# Patient Record
Sex: Male | Born: 1940 | Hispanic: No | Marital: Married | Smoking: Former smoker
Health system: Southern US, Community
[De-identification: ages and names within clinical notes are randomized; demographics above are authoritative.]

## PROBLEM LIST (undated history)

## (undated) DIAGNOSIS — N179 Acute kidney failure, unspecified: Secondary | ICD-10-CM

## (undated) DIAGNOSIS — D649 Anemia, unspecified: Secondary | ICD-10-CM

## (undated) DIAGNOSIS — F0280 Dementia in other diseases classified elsewhere without behavioral disturbance: Secondary | ICD-10-CM

## (undated) DIAGNOSIS — W19XXXA Unspecified fall, initial encounter: Secondary | ICD-10-CM

## (undated) DIAGNOSIS — B952 Enterococcus as the cause of diseases classified elsewhere: Secondary | ICD-10-CM

## (undated) DIAGNOSIS — N390 Urinary tract infection, site not specified: Secondary | ICD-10-CM

## (undated) DIAGNOSIS — G309 Alzheimer's disease, unspecified: Secondary | ICD-10-CM

---

## 2004-05-29 ENCOUNTER — Ambulatory Visit (HOSPITAL_BASED_OUTPATIENT_CLINIC_OR_DEPARTMENT_OTHER): Admission: RE | Admit: 2004-05-29 | Discharge: 2004-05-29 | Payer: Self-pay

## 2004-05-29 ENCOUNTER — Ambulatory Visit (HOSPITAL_COMMUNITY): Admission: RE | Admit: 2004-05-29 | Discharge: 2004-05-29 | Payer: Self-pay

## 2016-01-18 ENCOUNTER — Encounter (HOSPITAL_COMMUNITY): Payer: Self-pay

## 2016-01-18 ENCOUNTER — Inpatient Hospital Stay (HOSPITAL_COMMUNITY)
Admission: EM | Admit: 2016-01-18 | Discharge: 2016-01-23 | DRG: 689 | Disposition: A | Discharge status: Home Health | Payer: Medicare HMO | Attending: Family Medicine | Admitting: Family Medicine

## 2016-01-18 ENCOUNTER — Observation Stay (HOSPITAL_COMMUNITY): Payer: Medicare HMO

## 2016-01-18 DIAGNOSIS — D649 Anemia, unspecified: Secondary | ICD-10-CM | POA: Diagnosis present

## 2016-01-18 DIAGNOSIS — B952 Enterococcus as the cause of diseases classified elsewhere: Secondary | ICD-10-CM | POA: Diagnosis present

## 2016-01-18 DIAGNOSIS — F0391 Unspecified dementia with behavioral disturbance: Secondary | ICD-10-CM | POA: Diagnosis not present

## 2016-01-18 DIAGNOSIS — Z23 Encounter for immunization: Secondary | ICD-10-CM

## 2016-01-18 DIAGNOSIS — F0280 Dementia in other diseases classified elsewhere without behavioral disturbance: Secondary | ICD-10-CM

## 2016-01-18 DIAGNOSIS — F03918 Unspecified dementia, unspecified severity, with other behavioral disturbance: Secondary | ICD-10-CM | POA: Diagnosis present

## 2016-01-18 DIAGNOSIS — R410 Disorientation, unspecified: Secondary | ICD-10-CM | POA: Diagnosis present

## 2016-01-18 DIAGNOSIS — G9340 Encephalopathy, unspecified: Secondary | ICD-10-CM | POA: Diagnosis present

## 2016-01-18 DIAGNOSIS — N179 Acute kidney failure, unspecified: Secondary | ICD-10-CM | POA: Diagnosis present

## 2016-01-18 DIAGNOSIS — N390 Urinary tract infection, site not specified: Principal | ICD-10-CM | POA: Diagnosis present

## 2016-01-18 DIAGNOSIS — R451 Restlessness and agitation: Secondary | ICD-10-CM

## 2016-01-18 DIAGNOSIS — Z515 Encounter for palliative care: Secondary | ICD-10-CM

## 2016-01-18 DIAGNOSIS — Z87891 Personal history of nicotine dependence: Secondary | ICD-10-CM

## 2016-01-18 DIAGNOSIS — F0281 Dementia in other diseases classified elsewhere with behavioral disturbance: Secondary | ICD-10-CM | POA: Diagnosis present

## 2016-01-18 DIAGNOSIS — F0150 Vascular dementia without behavioral disturbance: Secondary | ICD-10-CM

## 2016-01-18 DIAGNOSIS — G4700 Insomnia, unspecified: Secondary | ICD-10-CM | POA: Diagnosis present

## 2016-01-18 DIAGNOSIS — Z66 Do not resuscitate: Secondary | ICD-10-CM | POA: Diagnosis present

## 2016-01-18 DIAGNOSIS — G309 Alzheimer's disease, unspecified: Secondary | ICD-10-CM | POA: Diagnosis present

## 2016-01-18 HISTORY — DX: Alzheimer's disease, unspecified: G30.9

## 2016-01-18 HISTORY — DX: Dementia in other diseases classified elsewhere, unspecified severity, without behavioral disturbance, psychotic disturbance, mood disturbance, and anxiety: F02.80

## 2016-01-18 LAB — CBC
HEMATOCRIT: 32.3 % — AB (ref 39.0–52.0)
HEMOGLOBIN: 10.1 g/dL — AB (ref 13.0–17.0)
MCH: 28.1 pg (ref 26.0–34.0)
MCHC: 31.3 g/dL (ref 30.0–36.0)
MCV: 90 fL (ref 78.0–100.0)
Platelets: 342 10*3/uL (ref 150–400)
RBC: 3.59 MIL/uL — AB (ref 4.22–5.81)
RDW: 14.7 % (ref 11.5–15.5)
WBC: 6.9 10*3/uL (ref 4.0–10.5)

## 2016-01-18 LAB — COMPREHENSIVE METABOLIC PANEL
ALT: 12 U/L — AB (ref 17–63)
ANION GAP: 7 (ref 5–15)
AST: 18 U/L (ref 15–41)
Albumin: 3.1 g/dL — ABNORMAL LOW (ref 3.5–5.0)
Alkaline Phosphatase: 69 U/L (ref 38–126)
BUN: 24 mg/dL — ABNORMAL HIGH (ref 6–20)
CHLORIDE: 113 mmol/L — AB (ref 101–111)
CO2: 24 mmol/L (ref 22–32)
CREATININE: 1.97 mg/dL — AB (ref 0.61–1.24)
Calcium: 9.4 mg/dL (ref 8.9–10.3)
GFR calc non Af Amer: 32 mL/min — ABNORMAL LOW (ref >60)
GFR, EST AFRICAN AMERICAN: 37 mL/min — AB (ref >60)
Glucose, Bld: 129 mg/dL — ABNORMAL HIGH (ref 65–99)
POTASSIUM: 5.5 mmol/L — AB (ref 3.5–5.1)
Sodium: 144 mmol/L (ref 135–145)
Total Bilirubin: 0.5 mg/dL (ref 0.3–1.2)
Total Protein: 6.5 g/dL (ref 6.5–8.1)

## 2016-01-18 LAB — URINALYSIS, ROUTINE W REFLEX MICROSCOPIC
GLUCOSE, UA: NEGATIVE mg/dL
HGB URINE DIPSTICK: NEGATIVE
Ketones, ur: NEGATIVE mg/dL
Nitrite: NEGATIVE
PH: 5 (ref 5.0–8.0)
Protein, ur: NEGATIVE mg/dL
SPECIFIC GRAVITY, URINE: 1.026 (ref 1.005–1.030)

## 2016-01-18 LAB — URINE MICROSCOPIC-ADD ON

## 2016-01-18 MED ORDER — AMOXICILLIN 500 MG PO CAPS
1000.0000 mg | ORAL_CAPSULE | Freq: Two times a day (BID) | ORAL | Status: DC
  Administered 2016-01-18 – 2016-01-23 (×10): 1000 mg via ORAL
  Filled 2016-01-18 (×11): qty 2

## 2016-01-18 MED ORDER — HALOPERIDOL LACTATE 5 MG/ML IJ SOLN
4.0000 mg | Freq: Once | INTRAMUSCULAR | Status: AC
  Administered 2016-01-18: 4 mg via INTRAMUSCULAR
  Filled 2016-01-18: qty 1

## 2016-01-18 MED ORDER — DONEPEZIL HCL 10 MG PO TABS
10.0000 mg | ORAL_TABLET | Freq: Every day | ORAL | Status: DC
  Administered 2016-01-19 – 2016-01-23 (×5): 10 mg via ORAL
  Filled 2016-01-18 (×2): qty 1
  Filled 2016-01-18 (×2): qty 2
  Filled 2016-01-18: qty 1

## 2016-01-18 MED ORDER — SODIUM CHLORIDE 0.9 % IV SOLN
INTRAVENOUS | Status: DC
  Administered 2016-01-18 – 2016-01-19 (×3): via INTRAVENOUS

## 2016-01-18 MED ORDER — HEPARIN SODIUM (PORCINE) 5000 UNIT/ML IJ SOLN
5000.0000 [IU] | Freq: Three times a day (TID) | INTRAMUSCULAR | Status: DC
  Administered 2016-01-18 – 2016-01-22 (×11): 5000 [IU] via SUBCUTANEOUS
  Filled 2016-01-18 (×11): qty 1

## 2016-01-18 MED ORDER — AMOXICILLIN-POT CLAVULANATE 875-125 MG PO TABS: 1.0000 | ORAL_TABLET | Freq: Two times a day (BID) | ORAL | Status: DC

## 2016-01-18 MED ORDER — MEMANTINE HCL ER 28 MG PO CP24
28.0000 mg | ORAL_CAPSULE | Freq: Every day | ORAL | Status: DC
  Administered 2016-01-19 – 2016-01-23 (×5): 28 mg via ORAL
  Filled 2016-01-18 (×5): qty 1

## 2016-01-18 MED ORDER — HALOPERIDOL LACTATE 5 MG/ML IJ SOLN
1.0000 mg | Freq: Four times a day (QID) | INTRAMUSCULAR | Status: DC | PRN
  Administered 2016-01-18: 1 mg via INTRAVENOUS
  Administered 2016-01-19 – 2016-01-20 (×2): 2 mg via INTRAVENOUS
  Filled 2016-01-18 (×3): qty 1

## 2016-01-18 MED ORDER — HALOPERIDOL LACTATE 5 MG/ML IJ SOLN: 2.5000 mg | Freq: Once | INTRAMUSCULAR | Status: DC

## 2016-01-18 NOTE — ED Notes (Signed)
Patient transported to CT 
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
## 2016-01-18 NOTE — Progress Notes (Signed)
Paged Julian ReilGardner about pt's current states. He ordered 4 mg of Haldol.

## 2016-01-18 NOTE — ED Triage Notes (Signed)
Pt. Has a hx of dementia and alzheimer's and family caregiver reports that pt.; is having increased confusion , wandering and combativeness.  Pt. Is on ciproflaxxcin presenlty, but family feels it is not helping.  Pt. Is oriented to self and place , not time. Skin is warm, pink and dry.  Pt.  Denies any pain

## 2016-01-18 NOTE — H&P (Signed)
History and Physical    Antonio Duran JYN:[id number] DOB: 08/03/1940 DOA: 01/18/2016   PCP: Pamelia HoitWILSON,FRED HENRY, MD Chief Complaint:  Chief Complaint  Patient presents with  . Altered Mental Status    HPI: Antonio Duran is a 75 y.o. male with medical history significant of Alzheimers dementia.  Patient brought in by family to the ED with gradual onset of increased confusion, combativeness, inappropriate behavior at home.  Patient had been brought in to PCP on 9/5 with urinary frequency complaints.  Started on cipro for presumed UTI.  Culture just came back this evening after patient was already in the ED and is now showing enterococcus.  ED Course: As noted, culture shows enterococcus, sensitive to amoxicillin.  UA shows persistent mild UTI / colonization findings.  Review of Systems: As per HPI otherwise 10 point review of systems negative.  Denies pain anywhere, no dysuria, fever, chills, cough, back pain, flank pain, etc. With the note that patient is demented, oriented to person and place but not time.   Past Medical History:  Diagnosis Date  . Alzheimer's dementia     History reviewed. No pertinent surgical history.   reports that he has quit smoking. He has never used smokeless tobacco. He reports that he does not drink alcohol or use drugs.  No Known Allergies  Family History  Problem Relation Age of Onset  . Dementia Sister      Prior to Admission medications   Medication Sig Start Date End Date Taking? Authorizing Provider  donepezil (ARICEPT) 10 MG tablet Take 10 mg by mouth daily.   Yes Historical Provider, MD  memantine (NAMENDA XR) 28 MG CP24 24 hr capsule Take 28 mg by mouth.   Yes Historical Provider, MD    Physical Exam: Vitals:   01/18/16 1745 01/18/16 1800 01/18/16 1815 01/18/16 1900  BP: 138/85 142/88 142/81 162/95  Pulse: (!) 59 (!) 57 (!) 53 (!) 59  Resp:      Temp:      TempSrc:      SpO2: 100% 100% 100% 100%  Height:           Constitutional: NAD, calm, comfortable Eyes: PERRL, lids and conjunctivae normal ENMT: Mucous membranes are moist. Posterior pharynx clear of any exudate or lesions.Normal dentition.  Neck: normal, supple, no masses, no thyromegaly Respiratory: clear to auscultation bilaterally, no wheezing, no crackles. Normal respiratory effort. No accessory muscle use.  Cardiovascular: Regular rate and rhythm, no murmurs / rubs / gallops. No extremity edema. 2+ pedal pulses. No carotid bruits.  Abdomen: no tenderness, no masses palpated. No hepatosplenomegaly. Bowel sounds positive.  Musculoskeletal: no clubbing / cyanosis. No joint deformity upper and lower extremities. Good ROM, no contractures. Normal muscle tone.  Skin: no rashes, lesions, ulcers. No induration Neurologic: CN 2-12 grossly intact. Sensation intact, DTR normal. Strength 5/5 in all 4.  Psychiatric: cooperative at this time, oriented to person, place, but not time.   Labs on Admission: I have personally reviewed following labs and imaging studies  CBC:  Recent Labs Lab 01/18/16 1428  WBC 6.9  HGB 10.1*  HCT 32.3*  MCV 90.0  PLT 342   Basic Metabolic Panel:  Recent Labs Lab 01/18/16 1428  NA 144  K 5.5*  CL 113*  CO2 24  GLUCOSE 129*  BUN 24*  CREATININE 1.97*  CALCIUM 9.4   GFR: CrCl cannot be calculated (Unknown ideal weight.). Liver Function Tests:  Recent Labs Lab 01/18/16 1428  AST 18  ALT 12*  ALKPHOS  69  BILITOT 0.5  PROT 6.5  ALBUMIN 3.1*   No results for input(s): LIPASE, AMYLASE in the last 168 hours. No results for input(s): AMMONIA in the last 168 hours. Coagulation Profile: No results for input(s): INR, PROTIME in the last 168 hours. Cardiac Enzymes: No results for input(s): CKTOTAL, CKMB, CKMBINDEX, TROPONINI in the last 168 hours. BNP (last 3 results) No results for input(s): PROBNP in the last 8760 hours. HbA1C: No results for input(s): HGBA1C in the last 72 hours. CBG: No  results for input(s): GLUCAP in the last 168 hours. Lipid Profile: No results for input(s): CHOL, HDL, LDLCALC, TRIG, CHOLHDL, LDLDIRECT in the last 72 hours. Thyroid Function Tests: No results for input(s): TSH, T4TOTAL, FREET4, T3FREE, THYROIDAB in the last 72 hours. Anemia Panel: No results for input(s): VITAMINB12, FOLATE, FERRITIN, TIBC, IRON, RETICCTPCT in the last 72 hours. Urine analysis:    Component Value Date/Time   COLORURINE YELLOW 01/18/2016 1444   APPEARANCEUR HAZY (A) 01/18/2016 1444   LABSPEC 1.026 01/18/2016 1444   PHURINE 5.0 01/18/2016 1444   GLUCOSEU NEGATIVE 01/18/2016 1444   HGBUR NEGATIVE 01/18/2016 1444   BILIRUBINUR SMALL (A) 01/18/2016 1444   KETONESUR NEGATIVE 01/18/2016 1444   PROTEINUR NEGATIVE 01/18/2016 1444   NITRITE NEGATIVE 01/18/2016 1444   LEUKOCYTESUR MODERATE (A) 01/18/2016 1444   Sepsis Labs: @LABRCNTIP (procalcitonin:4,lacticidven:4) )No results found for this or any previous visit (from the past 240 hour(s)).   Radiological Exams on Admission: No results found.  EKG: Independently reviewed.  Assessment/Plan Principal Problem:   Confusion Active Problems:   Enterococcus UTI   Dementia with behavioral disturbance    1. Confusion - 1. Concerned that there may be more going on than just the enterococcus in urine which is only 10k CFU (more colinization amount than the amount I would expect from a true UTI) 2. CT head 3. Gentle hydration 4. Repeat BMP in AM to monitor creatinine 5. Family trying to get most recent labs for comparison so we can see if this creat of 1.9 is new or not 2. Enterococcus in urine - UTI vs colonization 1. Will go ahead and treat with amoxicillin at this point due to confusion and lack of better explanation for worsening of mental status. 3. Dementia - continue home meds   DVT prophylaxis: Heparin [place] Code Status: Full Family Communication: Family at bedside Consults called: None Admission status: Admit  to obs   GARDNER, Heywood Iles DO [facility] Pager [phone] from 7PM-7AM  If 7AM-7PM, please contact the day physician for the patient [email or web address] Password [email or web address]  01/18/2016, 7:45 PM

## 2016-01-18 NOTE — ED Notes (Signed)
Dr. Jared at bedside. 

## 2016-01-19 DIAGNOSIS — F0281 Dementia in other diseases classified elsewhere with behavioral disturbance: Secondary | ICD-10-CM | POA: Diagnosis present

## 2016-01-19 DIAGNOSIS — N390 Urinary tract infection, site not specified: Principal | ICD-10-CM

## 2016-01-19 DIAGNOSIS — B952 Enterococcus as the cause of diseases classified elsewhere: Secondary | ICD-10-CM | POA: Diagnosis present

## 2016-01-19 DIAGNOSIS — R410 Disorientation, unspecified: Secondary | ICD-10-CM | POA: Diagnosis not present

## 2016-01-19 DIAGNOSIS — R451 Restlessness and agitation: Secondary | ICD-10-CM | POA: Diagnosis not present

## 2016-01-19 DIAGNOSIS — Z789 Other specified health status: Secondary | ICD-10-CM | POA: Diagnosis not present

## 2016-01-19 DIAGNOSIS — G4700 Insomnia, unspecified: Secondary | ICD-10-CM | POA: Diagnosis present

## 2016-01-19 DIAGNOSIS — Z87891 Personal history of nicotine dependence: Secondary | ICD-10-CM | POA: Diagnosis not present

## 2016-01-19 DIAGNOSIS — N179 Acute kidney failure, unspecified: Secondary | ICD-10-CM | POA: Diagnosis present

## 2016-01-19 DIAGNOSIS — G9340 Encephalopathy, unspecified: Secondary | ICD-10-CM | POA: Diagnosis present

## 2016-01-19 DIAGNOSIS — G309 Alzheimer's disease, unspecified: Secondary | ICD-10-CM | POA: Diagnosis present

## 2016-01-19 DIAGNOSIS — Z23 Encounter for immunization: Secondary | ICD-10-CM | POA: Diagnosis not present

## 2016-01-19 DIAGNOSIS — Z66 Do not resuscitate: Secondary | ICD-10-CM | POA: Diagnosis present

## 2016-01-19 DIAGNOSIS — D649 Anemia, unspecified: Secondary | ICD-10-CM | POA: Diagnosis present

## 2016-01-19 DIAGNOSIS — F0391 Unspecified dementia with behavioral disturbance: Secondary | ICD-10-CM

## 2016-01-19 LAB — BASIC METABOLIC PANEL
Anion gap: 7 (ref 5–15)
BUN: 16 mg/dL (ref 6–20)
CHLORIDE: 113 mmol/L — AB (ref 101–111)
CO2: 21 mmol/L — ABNORMAL LOW (ref 22–32)
Calcium: 9 mg/dL (ref 8.9–10.3)
Creatinine, Ser: 1.57 mg/dL — ABNORMAL HIGH (ref 0.61–1.24)
GFR calc Af Amer: 48 mL/min — ABNORMAL LOW (ref >60)
GFR calc non Af Amer: 42 mL/min — ABNORMAL LOW (ref >60)
GLUCOSE: 82 mg/dL (ref 65–99)
POTASSIUM: 4.1 mmol/L (ref 3.5–5.1)
Sodium: 141 mmol/L (ref 135–145)

## 2016-01-19 MED ORDER — LORAZEPAM 2 MG/ML IJ SOLN
1.0000 mg | Freq: Once | INTRAMUSCULAR | Status: AC
  Administered 2016-01-19: 1 mg via INTRAVENOUS
  Filled 2016-01-19: qty 1

## 2016-01-19 MED ORDER — PNEUMOCOCCAL VAC POLYVALENT 25 MCG/0.5ML IJ INJ
0.5000 mL | INJECTION | INTRAMUSCULAR | Status: AC
  Administered 2016-01-20: 0.5 mL via INTRAMUSCULAR
  Filled 2016-01-19: qty 0.5

## 2016-01-19 NOTE — Progress Notes (Signed)
PROGRESS NOTE    Antonio Duran  ZOX:[id number] DOB: Sep 22, 1940 DOA: 01/18/2016 PCP: Pamelia Hoit, MD   Brief Narrative: Antonio Duran is a 75 y.o. male with medical history significant of Alzheimers dementia. Patient brought in by family to the ED with gradual onset of increased confusion, combativeness, inappropriate behavior at home.  Patient had been brought in to PCP on 9/5 with urinary frequency complaints.  Started on cipro for presumed UTI.  Culture just came back this evening after patient was already in the ED and is now showing enterococcus.   Assessment & Plan:   Principal Problem:   Confusion Active Problems:   Enterococcus UTI   Dementia with behavioral disturbance   Confusion/acute encephalopathy Possibly secondary to infection. Initial workup is negative. More likely, this is delirium secondary to dementia. He is status post Haldol last night and is currently in restraints -remove restraints -sitter if becomes agitated -watch mental status over the day as he was still sedated from the Haldol  Enterococcus in urine Likely colonization. Possible UTI with current ureteral disturbance. -continue amoxicillin  Dementia Continue donepezil and memantine  AKI Unknown baseline. Improved from yesterday. -repeat BMP in AM   DVT prophylaxis: Heparin subq Code Status: Full Code Family Communication: Discussed with brother at bedside Disposition Plan: Likely discharge to SNF when workup complete   Consultants:   None  Procedures:   None  Antimicrobials:  Amoxicillin (9/7>>   Subjective: Patient reports no complaints overnight.  Objective: Vitals:   01/18/16 1900 01/18/16 1930 01/18/16 2052 01/19/16 0654  BP: 162/95 155/97 (!) 158/89 131/79  Pulse: (!) 59 (!) 53 (!) 103 74  Resp:   18 20  Temp:   97.6 F (36.4 C) 97.7 F (36.5 C)  TempSrc:   Oral Oral  SpO2: 100% 100% 100% 96%  Weight:   62.5 kg (137 lb 11.2 oz)   Height:   5\' 9"  (1.753  m)     Intake/Output Summary (Last 24 hours) at 01/19/16 1021 Last data filed at 01/19/16 0945  Gross per 24 hour  Intake           1062.5 ml  Output              950 ml  Net            112.5 ml   Filed Weights   01/18/16 2052  Weight: 62.5 kg (137 lb 11.2 oz)    Examination:  General exam: Appears calm and comfortable  Respiratory system: Clear to auscultation. Respiratory effort normal. Cardiovascular system: S1 & S2 heard, RRR. No murmurs, rubs, gallops or clicks Gastrointestinal system: Abdomen is nondistended, soft and nontender. No organomegaly or masses felt. Normal bowel sounds heard. Central nervous system: Alert and oriented x1. No focal neurological deficits. Extremities: No edema or cyanosis Psychiatry: Judgement and insight impaired. Mood & affect appear normal and flat.     Data Reviewed: I have personally reviewed following labs and imaging studies  CBC:  Recent Labs Lab 01/18/16 1428  WBC 6.9  HGB 10.1*  HCT 32.3*  MCV 90.0  PLT 342   Basic Metabolic Panel:  Recent Labs Lab 01/18/16 1428 01/19/16 0640  NA 144 141  K 5.5* 4.1  CL 113* 113*  CO2 24 21*  GLUCOSE 129* 82  BUN 24* 16  CREATININE 1.97* 1.57*  CALCIUM 9.4 9.0   GFR: Estimated Creatinine Clearance: 36.5 mL/min (by C-G formula based on SCr of 1.57 mg/dL). Liver Function Tests:  Recent Labs Lab  01/18/16 1428  AST 18  ALT 12*  ALKPHOS 69  BILITOT 0.5  PROT 6.5  ALBUMIN 3.1*   No results for input(s): LIPASE, AMYLASE in the last 168 hours. No results for input(s): AMMONIA in the last 168 hours. Coagulation Profile: No results for input(s): INR, PROTIME in the last 168 hours. Cardiac Enzymes: No results for input(s): CKTOTAL, CKMB, CKMBINDEX, TROPONINI in the last 168 hours. BNP (last 3 results) No results for input(s): PROBNP in the last 8760 hours. HbA1C: No results for input(s): HGBA1C in the last 72 hours. CBG: No results for input(s): GLUCAP in the last 168  hours. Lipid Profile: No results for input(s): CHOL, HDL, LDLCALC, TRIG, CHOLHDL, LDLDIRECT in the last 72 hours. Thyroid Function Tests: No results for input(s): TSH, T4TOTAL, FREET4, T3FREE, THYROIDAB in the last 72 hours. Anemia Panel: No results for input(s): VITAMINB12, FOLATE, FERRITIN, TIBC, IRON, RETICCTPCT in the last 72 hours. Sepsis Labs: No results for input(s): PROCALCITON, LATICACIDVEN in the last 168 hours.  No results found for this or any previous visit (from the past 240 hour(s)).       Radiology Studies: Ct Head Wo Contrast  Result Date: 01/18/2016 CLINICAL DATA:  Dementia.  Behavioral disturbance. EXAM: CT HEAD WITHOUT CONTRAST TECHNIQUE: Contiguous axial images were obtained from the base of the skull through the vertex without intravenous contrast. COMPARISON:  None. FINDINGS: Brain: The brainstem, cerebellum, cerebral peduncles, thalami, basal ganglia, basilar cisterns, and ventricular system appear within normal limits. Periventricular white matter and corona radiata hypodensities favor chronic ischemic microvascular white matter disease. No intracranial hemorrhage, mass lesion, or acute CVA. Vascular: Mild atherosclerosis. Skull: Unremarkable Sinuses/Orbits: Unremarkable where included. Other: No supplemental non-categorized findings. IMPRESSION: 1. No acute intracranial findings. 2. Periventricular white matter and corona radiata hypodensities favor chronic ischemic microvascular white matter disease. Electronically Signed   By: Gaylyn RongWalter  Liebkemann M.D.   On: 01/18/2016 20:42        Scheduled Meds: . amoxicillin  1,000 mg Oral Q12H  . donepezil  10 mg Oral Daily  . haloperidol lactate  2.5 mg Intravenous Once  . heparin  5,000 Units Subcutaneous Q8H  . memantine  28 mg Oral Daily   Continuous Infusions: . sodium chloride 75 mL/hr at 01/19/16 0000     LOS: 0 days     Jacquelin Hawkingalph Armanie Ullmer, MD [facility] 01/19/2016, 10:21 AM Pager: [phone]  If  7PM-7AM, please contact night-coverage [email or web address] Password [email or web address] 01/19/2016, 10:21 AM
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
## 2016-01-19 NOTE — ED Provider Notes (Signed)
[facility]ENCY DEPT [facility] Provider Note   CSN: [id number] Arrival date & time: 01/18/16  1352     History   Chief Complaint Chief Complaint  Patient presents with  . Altered Mental Status    HPI Antonio Duran is a 75 y.o. male.  HPI Patient presents to the emergency department with increased confusion and agitation.  The family is giving me the history is patient has Alzheimer's disease and he is noncommunicative in the room.  The family states that he seems to be more agitated and confused over the last few weeks.  They state that his doctor saw him and treated him for a UTI.  Patient has not seemed to improve since the treatment.  The son states that nothing seems make the condition better or worse.  She states that he has been getting more aggressive towards family.  They feel that he is not able to stay at home any longer and the doctor.  Advised to bring him to the emergency department for further evaluation Past Medical History:  Diagnosis Date  . Alzheimer's dementia     Patient Active Problem List   Diagnosis Date Noted  . Enterococcus UTI 01/18/2016  . Confusion 01/18/2016  . Dementia with behavioral disturbance 01/18/2016    History reviewed. No pertinent surgical history.     Home Medications    Prior to Admission medications   Medication Sig Start Date End Date Taking? Authorizing Provider  donepezil (ARICEPT) 10 MG tablet Take 10 mg by mouth daily.   Yes Historical Provider, MD  memantine (NAMENDA XR) 28 MG CP24 24 hr capsule Take 28 mg by mouth.   Yes Historical Provider, MD    Family History Family History  Problem Relation Age of Onset  . Dementia Sister     Social History Social History  Substance Use Topics  . Smoking status: Former Games developer  . Smokeless tobacco: Never Used  . Alcohol use No     Allergies   Review of patient's allergies indicates no known allergies.   Review of Systems Review of Systems Level V caveat applies due to  Alzheimer's disease  Physical Exam Updated Vital Signs BP (!) 153/86 (BP Location: Right Arm)   Pulse (!) 59   Temp 98.9 F (37.2 C) (Oral)   Resp 17   Ht 5\' 9"  (1.753 m)   Wt 62.5 kg   SpO2 96%   BMI 20.33 kg/m   Physical Exam  Constitutional: He appears well-developed and well-nourished. No distress.  HENT:  Head: Normocephalic and atraumatic.  Mouth/Throat: Oropharynx is clear and moist.  Eyes: Pupils are equal, round, and reactive to light.  Neck: Normal range of motion. Neck supple.  Cardiovascular: Normal rate, regular rhythm and normal heart sounds.  Exam reveals no gallop and no friction rub.   No murmur heard. Pulmonary/Chest: Effort normal and breath sounds normal. No respiratory distress. He has no wheezes.  Abdominal: Soft. Bowel sounds are normal. He exhibits no distension. There is no tenderness.  Neurological: He is alert. He exhibits normal muscle tone. Coordination normal.  Skin: Skin is warm and dry. No rash noted. No erythema.  Psychiatric: He has a normal mood and affect. His behavior is normal.  Nursing note and vitals reviewed.    ED Treatments / Results  Labs (all labs ordered are listed, but only abnormal results are displayed) Labs Reviewed  COMPREHENSIVE METABOLIC PANEL - Abnormal; Notable for the following:       Result Value   Potassium  5.5 (*)    Chloride 113 (*)    Glucose, Bld 129 (*)    BUN 24 (*)    Creatinine, Ser 1.97 (*)    Albumin 3.1 (*)    ALT 12 (*)    GFR calc non Af Amer 32 (*)    GFR calc Af Amer 37 (*)    All other components within normal limits  CBC - Abnormal; Notable for the following:    RBC 3.59 (*)    Hemoglobin 10.1 (*)    HCT 32.3 (*)    All other components within normal limits  URINALYSIS, ROUTINE W REFLEX MICROSCOPIC (NOT AT [facility]) - Abnormal; Notable for the following:    APPearance HAZY (*)    Bilirubin Urine SMALL (*)    Leukocytes, UA MODERATE (*)    All other components within normal limits  URINE  MICROSCOPIC-ADD ON - Abnormal; Notable for the following:    Squamous Epithelial / LPF 0-5 (*)    Bacteria, UA FEW (*)    Casts HYALINE CASTS (*)    Crystals CA OXALATE CRYSTALS (*)    All other components within normal limits  BASIC METABOLIC PANEL - Abnormal; Notable for the following:    Chloride 113 (*)    CO2 21 (*)    Creatinine, Ser 1.57 (*)    GFR calc non Af Amer 42 (*)    GFR calc Af Amer 48 (*)    All other components within normal limits  BASIC METABOLIC PANEL    EKG  EKG Interpretation None       Radiology Ct Head Wo Contrast  Result Date: 01/18/2016 CLINICAL DATA:  Dementia.  Behavioral disturbance. EXAM: CT HEAD WITHOUT CONTRAST TECHNIQUE: Contiguous axial images were obtained from the base of the skull through the vertex without intravenous contrast. COMPARISON:  None. FINDINGS: Brain: The brainstem, cerebellum, cerebral peduncles, thalami, basal ganglia, basilar cisterns, and ventricular system appear within normal limits. Periventricular white matter and corona radiata hypodensities favor chronic ischemic microvascular white matter disease. No intracranial hemorrhage, mass lesion, or acute CVA. Vascular: Mild atherosclerosis. Skull: Unremarkable Sinuses/Orbits: Unremarkable where included. Other: No supplemental non-categorized findings. IMPRESSION: 1. No acute intracranial findings. 2. Periventricular white matter and corona radiata hypodensities favor chronic ischemic microvascular white matter disease. Electronically Signed   By: Gaylyn RongWalter  Liebkemann M.D.   On: 01/18/2016 20:42    Procedures Procedures (including critical care time)  Medications Ordered in ED Medications  memantine (NAMENDA XR) 24 hr capsule 28 mg (28 mg Oral Given 01/19/16 1034)  donepezil (ARICEPT) tablet 10 mg (10 mg Oral Given 01/19/16 1034)  0.9 %  sodium chloride infusion ( Intravenous New Bag/Given 01/19/16 1055)  heparin injection 5,000 Units (5,000 Units Subcutaneous Given 01/19/16 1348)    amoxicillin (AMOXIL) capsule 1,000 mg (1,000 mg Oral Given 01/19/16 1034)  haloperidol lactate (HALDOL) injection 1-2 mg (1 mg Intravenous Given 01/18/16 2111)  haloperidol lactate (HALDOL) injection 2.5 mg (2.5 mg Intravenous Not Given 01/18/16 2332)  haloperidol lactate (HALDOL) injection 4 mg (4 mg Intramuscular Given 01/18/16 2331)     Initial Impression / Assessment and Plan / ED Course  I have reviewed the triage vital signs and the nursing notes.  Pertinent labs & imaging results that were available during my care of the patient were reviewed by me and considered in my medical decision making (see chart for details).  Clinical Course  Value Comment By Time  Creatinine: (!) 1.97 (Reviewed) Rolland PorterMark James, MD 09/07 1812  Creatinine: Marland Kitchen(!)  1.97 (Reviewed) Rolland Porter, MD 09/07 1812  Creatinine: (!) 1.97 (Reviewed) Rolland Porter, MD 09/07 1812    Patient will be admitted to the hospital for further evaluation and care.  Spoke with the [facility] hospitalist, who will be down to evaluate the patient.  The patient does have an elevated creatinine, which seems new along with possible urinary tract infection  Final Clinical Impressions(s) / ED Diagnoses   Final diagnoses:  Dementia with behavioral disturbance    New Prescriptions Current Discharge Medication List       Charlestine Night, PA-C 01/19/16 1735    Rolland Porter, MD 01/28/16 [phone]

## 2016-01-19 NOTE — Progress Notes (Signed)
CSW met with pt family at bedside- discussed process to get pt LTC placement for memory care and provided resources to assist in this process  CSW explained that pt likely NOT SNF appropriate based on their reports that his has high level of mobility at home and no decreased mobility leading up to admission- explained that insurance would not approve SNF stay unless pt is below baseline functioning.  CSW also explained that even if insurance approved initial stay it would likely only be for a few days to a week since no reported reason for physical impairment and no medical need for SNF so would not solve any long term placement needs for this patient  CSW will continue to follow   Jorge Ny, [place] Social Worker [phone]

## 2016-01-19 NOTE — Care Management Note (Addendum)
Case Management Note  Patient Details  Name: Antonio Duran MRN: [id number] Date of Birth: 05/04/1941  Subjective/Objective:            Patient in obs from home with acute confusion,agitation. Patient in restraints and mittens. Patient has HHA during dau and family stays at his home to supervise him at night. [place] with son and daughter in law I  The room. They state that they are conserned about his ability to care for himself and his safety at home, they are afraid of him wondering and refer to him having Alzheimer's. CM addressed dispo plan. Encouraged family to eduacte themselves well on what payor covers for memory care and LTC. Explained difference btwn LTC and acute rehab with goals and skillable needs. Cm asked MD for PT eval and updated [place] CSW to speak to family about placement in the future. CM [facility] and family at discharge. Patient active with [facility].   Action/Plan:  Addendum 01-23-16 [facility], CM requested for MD to place order for PT OT RN HHA and SLP so patient can participate in Memory Care program through [facility].   Expected Discharge Date:                  Expected Discharge Plan:  Home w Home Health Services  In-House Referral:  Clinical Social Work  Discharge planning Services  CM Consult  Post Acute Care Choice:  Home Health Choice offered to:  Adult Children  DME Arranged:  N/A DME Agency:  NA  HH Arranged:  RN, PT, OT, Nurse's Aide, Social Work [facility] Agency:  [facility]  Status of Service:  Completed, signed off  If discussed at [employer] Meetings, dates discussed:    Additional Comments:  Antonio SabalDebbie Dane Kopke, RN 01/19/2016, 1:38 PM

## 2016-01-19 NOTE — Care Management Obs Status (Signed)
MEDICARE OBSERVATION STATUS NOTIFICATION   Patient Details  Name: Antonio Duran Orner MRN: [id number] Date of Birth: 05/26/1940   Medicare Observation Status Notification Given:  Yes    Lawerance Sabalebbie Isak Sotomayor, RN 01/19/2016, 12:12 PM

## 2016-01-20 LAB — BASIC METABOLIC PANEL
ANION GAP: 6 (ref 5–15)
BUN: 13 mg/dL (ref 6–20)
CHLORIDE: 115 mmol/L — AB (ref 101–111)
CO2: 23 mmol/L (ref 22–32)
Calcium: 9.1 mg/dL (ref 8.9–10.3)
Creatinine, Ser: 1.33 mg/dL — ABNORMAL HIGH (ref 0.61–1.24)
GFR calc Af Amer: 59 mL/min — ABNORMAL LOW (ref >60)
GFR calc non Af Amer: 51 mL/min — ABNORMAL LOW (ref >60)
Glucose, Bld: 64 mg/dL — ABNORMAL LOW (ref 65–99)
POTASSIUM: 4.6 mmol/L (ref 3.5–5.1)
SODIUM: 144 mmol/L (ref 135–145)

## 2016-01-20 LAB — GLUCOSE, CAPILLARY: GLUCOSE-CAPILLARY: 74 mg/dL (ref 65–99)

## 2016-01-20 MED ORDER — SODIUM CHLORIDE 0.45 % IV SOLN
INTRAVENOUS | Status: DC
  Administered 2016-01-20 (×2): via INTRAVENOUS

## 2016-01-20 MED ORDER — QUETIAPINE FUMARATE 25 MG PO TABS
25.0000 mg | ORAL_TABLET | Freq: Two times a day (BID) | ORAL | Status: DC
  Administered 2016-01-20 – 2016-01-21 (×3): 25 mg via ORAL
  Filled 2016-01-20 (×3): qty 1

## 2016-01-20 MED ORDER — LORAZEPAM 2 MG/ML IJ SOLN: 1.0000 mg | Freq: Four times a day (QID) | INTRAMUSCULAR | Status: DC | PRN

## 2016-01-20 NOTE — Therapy (Signed)
[place] [facility]    , [place],   Phone:     Fax:     Patient Details  Name: Antonio Duran MRN: [id number] Date of Birth: 06/13/1940 Referring Provider:  No ref. provider found  Encounter Date: 01/18/2016   Antonio Duran 01/20/2016, 10:55 AM  [place] [facility]    , [place],   Phone:     Fax:      Physical therapy attempted to see the patient. Per nursing the patient has been combative this morning and is currently asleep/ in 4 point restraints. Nursing requested therapy come back at a later time. Therapy will attempt to assess patient in the afternoon if time permits.

## 2016-01-20 NOTE — Progress Notes (Signed)
PROGRESS NOTE    Antonio Duran  ZOX:[id number] DOB: Oct 01, 1940 DOA: 01/18/2016 PCP: Pamelia Hoit, MD   Brief Narrative: Antonio Duran is a 75 y.o. male with medical history significant of Alzheimers dementia. Patient brought in by family to the ED with gradual onset of increased confusion, combativeness, inappropriate behavior at home.  Patient had been brought in to PCP on 9/5 with urinary frequency complaints.  Started on cipro for presumed UTI.  Culture just came back this evening after patient was already in the ED and is now showing enterococcus.   Assessment & Plan:   Principal Problem:   Confusion Active Problems:   Enterococcus UTI   Dementia with behavioral disturbance   Confusion/acute encephalopathy Possibly secondary to infection. Initial workup is negative. More likely, this is delirium secondary to dementia. Patient continues to attempt to get up from bed. -Seroquel 25mg  bid -ativan 1mg  q6hrs prn -sitter when available -avoid restraints as much as possible  Enterococcus in urine Likely colonization. Possible UTI with current ureteral disturbance. -continue amoxicillin  Dementia  Continue donepezil and memantine  AKI Unknown baseline. Continues to improve. -repeat BMP in AM   DVT prophylaxis: Heparin subq Code Status: Full Code Family Communication: Discussed with brother at bedside Disposition Plan: Likely discharge to SNF when workup complete   Consultants:   None  Procedures:   None  Antimicrobials:  Amoxicillin (9/7>>   Subjective: Patient has no concerns. He reports no pain.  Objective: Vitals:   01/19/16 1438 01/19/16 2202 01/19/16 2327 01/20/16 0555  BP: (!) 153/86  125/85 (!) 153/76  Pulse: (!) 59 63  68  Resp: 17 17  17   Temp: 98.9 F (37.2 C) 98.3 F (36.8 C)  97.3 F (36.3 C)  TempSrc: Oral Oral  Axillary  SpO2:  98%  100%  Weight:      Height:        Intake/Output Summary (Last 24 hours) at 01/20/16  1022 Last data filed at 01/20/16 0900  Gross per 24 hour  Intake           1867.5 ml  Output             2100 ml  Net           -232.5 ml   Filed Weights   01/18/16 2052  Weight: 62.5 kg (137 lb 11.2 oz)    Examination:  General exam: Appears calm and comfortable . Sleeping and easily arousable Respiratory system: Clear to auscultation. Respiratory effort normal. Cardiovascular system: S1 & S2 heard, RRR. No murmurs, rubs, gallops or clicks Gastrointestinal system: Abdomen is nondistended, soft and nontender. No organomegaly or masses felt. Normal bowel sounds heard. Central nervous system: Alert and oriented x1. No focal neurological deficits. Extremities: No edema or cyanosis Psychiatry: Judgement and insight impaired. Mood & affect appear normal and flat.     Data Reviewed: I have personally reviewed following labs and imaging studies  CBC:  Recent Labs Lab 01/18/16 1428  WBC 6.9  HGB 10.1*  HCT 32.3*  MCV 90.0  PLT 342   Basic Metabolic Panel:  Recent Labs Lab 01/18/16 1428 01/19/16 0640 01/20/16 0546  NA 144 141 144  K 5.5* 4.1 4.6  CL 113* 113* 115*  CO2 24 21* 23  GLUCOSE 129* 82 64*  BUN 24* 16 13  CREATININE 1.97* 1.57* 1.33*  CALCIUM 9.4 9.0 9.1   GFR: Estimated Creatinine Clearance: 43.1 mL/min (by C-G formula based on SCr of 1.33 mg/dL). Liver Function Tests:  Recent Labs Lab 01/18/16 1428  AST 18  ALT 12*  ALKPHOS 69  BILITOT 0.5  PROT 6.5  ALBUMIN 3.1*   No results for input(s): LIPASE, AMYLASE in the last 168 hours. No results for input(s): AMMONIA in the last 168 hours. Coagulation Profile: No results for input(s): INR, PROTIME in the last 168 hours. Cardiac Enzymes: No results for input(s): CKTOTAL, CKMB, CKMBINDEX, TROPONINI in the last 168 hours. BNP (last 3 results) No results for input(s): PROBNP in the last 8760 hours. HbA1C: No results for input(s): HGBA1C in the last 72 hours. CBG:  Recent Labs Lab 01/20/16 0825   GLUCAP 74   Lipid Profile: No results for input(s): CHOL, HDL, LDLCALC, TRIG, CHOLHDL, LDLDIRECT in the last 72 hours. Thyroid Function Tests: No results for input(s): TSH, T4TOTAL, FREET4, T3FREE, THYROIDAB in the last 72 hours. Anemia Panel: No results for input(s): VITAMINB12, FOLATE, FERRITIN, TIBC, IRON, RETICCTPCT in the last 72 hours. Sepsis Labs: No results for input(s): PROCALCITON, LATICACIDVEN in the last 168 hours.  No results found for this or any previous visit (from the past 240 hour(s)).       Radiology Studies: Ct Head Wo Contrast  Result Date: 01/18/2016 CLINICAL DATA:  Dementia.  Behavioral disturbance. EXAM: CT HEAD WITHOUT CONTRAST TECHNIQUE: Contiguous axial images were obtained from the base of the skull through the vertex without intravenous contrast. COMPARISON:  None. FINDINGS: Brain: The brainstem, cerebellum, cerebral peduncles, thalami, basal ganglia, basilar cisterns, and ventricular system appear within normal limits. Periventricular white matter and corona radiata hypodensities favor chronic ischemic microvascular white matter disease. No intracranial hemorrhage, mass lesion, or acute CVA. Vascular: Mild atherosclerosis. Skull: Unremarkable Sinuses/Orbits: Unremarkable where included. Other: No supplemental non-categorized findings. IMPRESSION: 1. No acute intracranial findings. 2. Periventricular white matter and corona radiata hypodensities favor chronic ischemic microvascular white matter disease. Electronically Signed   By: Gaylyn RongWalter  Liebkemann M.D.   On: 01/18/2016 20:42        Scheduled Meds: . amoxicillin  1,000 mg Oral Q12H  . donepezil  10 mg Oral Daily  . heparin  5,000 Units Subcutaneous Q8H  . memantine  28 mg Oral Daily  . QUEtiapine  25 mg Oral BID   Continuous Infusions: . sodium chloride 75 mL/hr at 01/20/16 0959     LOS: 1 day     Jacquelin Hawkingalph Rosalio Catterton, MD [facility] 01/20/2016, 10:22 AM Pager: [phone]  If 7PM-7AM,  please contact night-coverage [email or web address] Password [email or web address] 01/20/2016, 10:22 AM

## 2016-01-20 NOTE — Progress Notes (Signed)
Patient constantly attempting to get out of bed. MD notified. Patient will have sleep period of about 5 minutes and them will attempt to get out of bed again. MD by to assess patient and we will try soft waist restraint, bed alarm and order a slow bed. Patient is pending to camera room and low bed ordered.

## 2016-01-21 LAB — BASIC METABOLIC PANEL
ANION GAP: 4 — AB (ref 5–15)
BUN: 13 mg/dL (ref 6–20)
CALCIUM: 8.9 mg/dL (ref 8.9–10.3)
CO2: 24 mmol/L (ref 22–32)
Chloride: 115 mmol/L — ABNORMAL HIGH (ref 101–111)
Creatinine, Ser: 1.43 mg/dL — ABNORMAL HIGH (ref 0.61–1.24)
GFR, EST AFRICAN AMERICAN: 54 mL/min — AB (ref >60)
GFR, EST NON AFRICAN AMERICAN: 47 mL/min — AB (ref >60)
GLUCOSE: 92 mg/dL (ref 65–99)
POTASSIUM: 4 mmol/L (ref 3.5–5.1)
Sodium: 143 mmol/L (ref 135–145)

## 2016-01-21 LAB — CBC
HEMATOCRIT: 29.1 % — AB (ref 39.0–52.0)
HEMOGLOBIN: 9.1 g/dL — AB (ref 13.0–17.0)
MCH: 27.9 pg (ref 26.0–34.0)
MCHC: 31.3 g/dL (ref 30.0–36.0)
MCV: 89.3 fL (ref 78.0–100.0)
Platelets: 308 10*3/uL (ref 150–400)
RBC: 3.26 MIL/uL — AB (ref 4.22–5.81)
RDW: 14.7 % (ref 11.5–15.5)
WBC: 6 10*3/uL (ref 4.0–10.5)

## 2016-01-21 MED ORDER — LORAZEPAM 1 MG PO TABS
1.0000 mg | ORAL_TABLET | Freq: Four times a day (QID) | ORAL | Status: DC | PRN
  Administered 2016-01-21 – 2016-01-23 (×6): 1 mg via ORAL
  Filled 2016-01-21 (×6): qty 1

## 2016-01-21 MED ORDER — QUETIAPINE FUMARATE 25 MG PO TABS
50.0000 mg | ORAL_TABLET | Freq: Two times a day (BID) | ORAL | Status: DC
  Administered 2016-01-21 – 2016-01-23 (×4): 50 mg via ORAL
  Filled 2016-01-21 (×4): qty 2

## 2016-01-21 MED ORDER — HALOPERIDOL LACTATE 5 MG/ML IJ SOLN
2.5000 mg | Freq: Once | INTRAMUSCULAR | Status: AC
  Administered 2016-01-21: 2.5 mg via INTRAMUSCULAR
  Filled 2016-01-21: qty 1

## 2016-01-21 MED ORDER — LORAZEPAM 2 MG/ML IJ SOLN: 1.0000 mg | Freq: Four times a day (QID) | INTRAMUSCULAR | Status: DC | PRN

## 2016-01-21 NOTE — Plan of Care (Signed)
Problem: Education: Goal: Knowledge of [facility] Education information/materials will improve Outcome: Not Progressing Patient has dementia and is not learning the education he is given.

## 2016-01-21 NOTE — Progress Notes (Signed)
Patient becoming increasing anxious and more confused as the evening turns to night. Seroquel given a little early Dr. Caleb PoppNettey aware. Spoke to NewtonSonny his son about having a family member come to sit with him, he said he would try to find someone.

## 2016-01-21 NOTE — Evaluation (Signed)
Physical Therapy Evaluation Patient Details Name: Antonio Duran MRN: [id number] DOB: May 01, 1941 Today's Date: 01/21/2016   History of Present Illness  Patient is a 75 yo male admitted 01/18/16 with increased confusion.  Patient with UTI, AKI.    PMH:  Alzheimers dementia with behavioral disturbance,   Clinical Impression  Patient presents with problems listed below.  Will benefit from acute PT to maximize functional mobility prior to d/c.  Patient with decreased balance and cognition impacting gait/safety.  Recommend SNF at d/c for continued PT.  Or patient would be appropriate for memory care unit with PT available.    Follow Up Recommendations SNF;Supervision/Assistance - 24 hour (Or Memory Care Unit with PT available)    Equipment Recommendations  None recommended by PT    Recommendations for Other Services       Precautions / Restrictions Precautions Precautions: Fall Precaution Comments: confusion, agitation Restrictions Weight Bearing Restrictions: No      Mobility  Bed Mobility Overal bed mobility: Independent                Transfers Overall transfer level: Needs assistance Equipment used: None Transfers: Sit to/from Stand Sit to Stand: Supervision         General transfer comment: For safety only.  Ambulation/Gait Ambulation/Gait assistance: Min guard;Min assist;+2 safety/equipment Ambulation Distance (Feet): 200 Feet Assistive device: None Gait Pattern/deviations: Step-through pattern;Decreased stride length;Staggering left;Staggering right   Gait velocity interpretation: at or above normal speed for age/gender General Gait Details: Patient with unsteady gait, especially with head turns and stepping around obstacles.  Patient running into obstacles in hallway.  Loses balance when turning head to talk with PT during gait.  Stairs            Wheelchair Mobility    Modified Rankin (Stroke Patients Only)       Balance Overall balance  assessment: Needs assistance         Standing balance support: No upper extremity supported Standing balance-Leahy Scale: Good Standing balance comment: Somewhat unsteady during dynamic activities                             Pertinent Vitals/Pain Pain Assessment: No/denies pain    Home Living Family/patient expects to be discharged to:: Private residence Living Arrangements: Alone Available Help at Discharge: Family;Personal care attendant;Available 24 hours/day (Son, daughter-in-law, and Aide) Type of Home: House           Additional Comments: Patient unable to provide home living information.  Reports he lives with his wife.    Prior Function Level of Independence: Independent;Needs assistance   Gait / Transfers Assistance Needed: Patient ambulates independently.  However needs supervision for safety related to when/where he walks.  ADL's / Homemaking Assistance Needed: Assist with meal prep, ADL's, housekeeping  Comments: Patient unable to provide information.     Hand Dominance        Extremity/Trunk Assessment   Upper Extremity Assessment: Overall WFL for tasks assessed           Lower Extremity Assessment: Generalized weakness         Communication   Communication: No difficulties  Cognition Arousal/Alertness: Awake/alert Behavior During Therapy: Restless;Impulsive Overall Cognitive Status: Impaired/Different from baseline Area of Impairment: Orientation;Memory;Following commands;Safety/judgement;Awareness;Problem solving Orientation Level: Disoriented to;Time;Situation   Memory: Decreased short-term memory Following Commands: Follows multi-step commands with increased time Safety/Judgement: Decreased awareness of safety   Problem Solving: Difficulty sequencing;Requires verbal cues General Comments: Patient  reports it is [id number] and that he is 75 yo.  Reports he lives with his wife.  Patient very impulsive with decreased safety  awareness.    General Comments      Exercises        Assessment/Plan    PT Assessment Patient needs continued PT services  PT Diagnosis Abnormality of gait;Generalized weakness;Altered mental status   PT Problem List Decreased strength;Decreased balance;Decreased mobility;Decreased cognition;Decreased safety awareness  PT Treatment Interventions DME instruction;Gait training;Functional mobility training;Therapeutic activities;Balance training;Patient/family education;Cognitive remediation   PT Goals (Current goals can be found in the Care Plan section) Acute Rehab PT Goals Patient Stated Goal: To go home PT Goal Formulation: Patient unable to participate in goal setting Time For Goal Achievement: 01/28/16 Potential to Achieve Goals: Fair    Frequency Min 2X/week   Barriers to discharge        Co-evaluation               End of Session Equipment Utilized During Treatment: Gait belt Activity Tolerance: Patient tolerated treatment well Patient left: in bed;with call bell/phone within reach;with bed alarm set;with restraints reapplied (4 rails up) Nurse Communication: Mobility status         Time: [id number] PT Time Calculation (min) (ACUTE ONLY): 12 min   Charges:   PT Evaluation $PT Eval Moderate Complexity: 1 Procedure     PT G CodesVena Duran:        Antonio Duran 01/21/2016, 3:35 PM Durenda HurtSusan Duran. Renaldo Duran, PT, [facility] Acute Rehab Services Pager [phone]

## 2016-01-21 NOTE — Progress Notes (Signed)
PROGRESS NOTE    Antonio Duran  ZOX:[id number] DOB: 1940/11/06 DOA: 01/18/2016 PCP: Pamelia Hoit, MD   Brief Narrative: Antonio Duran is a 75 y.o. male with medical history significant of Alzheimers dementia. Patient brought in by family to the ED with gradual onset of increased confusion, combativeness, inappropriate behavior at home.  Patient had been brought in to PCP on 9/5 with urinary frequency complaints.  Started on cipro for presumed UTI.  Culture just came back this evening after patient was already in the ED and is now showing enterococcus.   Assessment & Plan:   Principal Problem:   Confusion Active Problems:   Enterococcus UTI   Dementia with behavioral disturbance   Confusion/acute encephalopathy Possibly secondary to infection progression of dementia with episodes of delirium. Patient has been getting up from bed less. No reports of combativeness last night -Seroquel 25mg  bid -ativan 1mg  q6hrs prn -sitter when available -avoid restraints as much as possible  Enterococcus in urine Likely colonization. Possible UTI with current ureteral disturbance. -continue amoxicillin  Dementia  -Continue donepezil and memantine -Continue seroquel 25mg  BID  AKI Unknown baseline. Stable  Anemia Unknown if chronic. Normocytic. No evidence of bleeding.  -Will start with an iron panel. -Follow CBC  DVT prophylaxis: Heparin subq Code Status: Full Code Family Communication: Discussed with brother at bedside Disposition Plan: Likely discharge to SNF when workup complete   Consultants:   None  Procedures:   None  Antimicrobials:  Amoxicillin (9/7>>   Subjective: Patient has no concerns. He reports no pain. He wants go home tomorrow.  Objective: Vitals:   01/20/16 0555 01/20/16 1310 01/20/16 2120 01/21/16 0551  BP: (!) 153/76 121/88 (!) 145/94 132/88  Pulse: 68 63 64 (!) 57  Resp: 17 20 18 18   Temp: 97.3 F (36.3 C) 98 F (36.7 C) 98.2 F (36.8  C) 98 F (36.7 C)  TempSrc: Axillary Oral Oral Oral  SpO2: 100%  99% 98%  Weight:      Height:        Intake/Output Summary (Last 24 hours) at 01/21/16 1310 Last data filed at 01/21/16 1131  Gross per 24 hour  Intake           1839.5 ml  Output             1900 ml  Net            -60.5 ml   Filed Weights   01/18/16 2052  Weight: 62.5 kg (137 lb 11.2 oz)    Examination:  General exam: Appears calm and comfortable  Respiratory system: Clear to auscultation. Respiratory effort normal. Cardiovascular system: S1 & S2 heard, RRR. No murmurs, rubs, gallops or clicks Gastrointestinal system: Abdomen is nondistended, soft and nontender. No organomegaly or masses felt. Normal bowel sounds heard. Central nervous system: Alert and oriented x1. No focal neurological deficits. Extremities: No edema or cyanosis Psychiatry: Judgement and insight impaired. Mood & affect appear normal and flat.     Data Reviewed: I have personally reviewed following labs and imaging studies  CBC:  Recent Labs Lab 01/18/16 1428 01/21/16 0849  WBC 6.9 6.0  HGB 10.1* 9.1*  HCT 32.3* 29.1*  MCV 90.0 89.3  PLT 342 308   Basic Metabolic Panel:  Recent Labs Lab 01/18/16 1428 01/19/16 0640 01/20/16 0546 01/21/16 0849  NA 144 141 144 143  K 5.5* 4.1 4.6 4.0  CL 113* 113* 115* 115*  CO2 24 21* 23 24  GLUCOSE 129* 82 64* 92  BUN 24* 16 13 13   CREATININE 1.97* 1.57* 1.33* 1.43*  CALCIUM 9.4 9.0 9.1 8.9   GFR: Estimated Creatinine Clearance: 40.1 mL/min (by C-G formula based on SCr of 1.43 mg/dL). Liver Function Tests:  Recent Labs Lab 01/18/16 1428  AST 18  ALT 12*  ALKPHOS 69  BILITOT 0.5  PROT 6.5  ALBUMIN 3.1*   No results for input(s): LIPASE, AMYLASE in the last 168 hours. No results for input(s): AMMONIA in the last 168 hours. Coagulation Profile: No results for input(s): INR, PROTIME in the last 168 hours. Cardiac Enzymes: No results for input(s): CKTOTAL, CKMB, CKMBINDEX,  TROPONINI in the last 168 hours. BNP (last 3 results) No results for input(s): PROBNP in the last 8760 hours. HbA1C: No results for input(s): HGBA1C in the last 72 hours. CBG:  Recent Labs Lab 01/20/16 0825  GLUCAP 74   Lipid Profile: No results for input(s): CHOL, HDL, LDLCALC, TRIG, CHOLHDL, LDLDIRECT in the last 72 hours. Thyroid Function Tests: No results for input(s): TSH, T4TOTAL, FREET4, T3FREE, THYROIDAB in the last 72 hours. Anemia Panel: No results for input(s): VITAMINB12, FOLATE, FERRITIN, TIBC, IRON, RETICCTPCT in the last 72 hours. Sepsis Labs: No results for input(s): PROCALCITON, LATICACIDVEN in the last 168 hours.  No results found for this or any previous visit (from the past 240 hour(s)).       Radiology Studies: No results found.      Scheduled Meds: . amoxicillin  1,000 mg Oral Q12H  . donepezil  10 mg Oral Daily  . heparin  5,000 Units Subcutaneous Q8H  . memantine  28 mg Oral Daily  . QUEtiapine  25 mg Oral BID   Continuous Infusions:     LOS: 2 days     Jacquelin Hawkingalph Shanequia Kendrick, MD [facility] 01/21/2016, 1:10 PM Pager: [phone]  If 7PM-7AM, please contact night-coverage [email or web address] Password [email or web address] 01/21/2016, 1:10 PM

## 2016-01-22 DIAGNOSIS — R451 Restlessness and agitation: Secondary | ICD-10-CM

## 2016-01-22 DIAGNOSIS — Z789 Other specified health status: Secondary | ICD-10-CM

## 2016-01-22 DIAGNOSIS — G4700 Insomnia, unspecified: Secondary | ICD-10-CM

## 2016-01-22 DIAGNOSIS — Z515 Encounter for palliative care: Secondary | ICD-10-CM

## 2016-01-22 LAB — CBC
HCT: 29.9 % — ABNORMAL LOW (ref 39.0–52.0)
Hemoglobin: 9.4 g/dL — ABNORMAL LOW (ref 13.0–17.0)
MCH: 28.1 pg (ref 26.0–34.0)
MCHC: 31.4 g/dL (ref 30.0–36.0)
MCV: 89.3 fL (ref 78.0–100.0)
PLATELETS: 308 10*3/uL (ref 150–400)
RBC: 3.35 MIL/uL — ABNORMAL LOW (ref 4.22–5.81)
RDW: 15 % (ref 11.5–15.5)
WBC: 4.9 10*3/uL (ref 4.0–10.5)

## 2016-01-22 LAB — IRON AND TIBC
IRON: 57 ug/dL (ref 45–182)
SATURATION RATIOS: 26 % (ref 17.9–39.5)
TIBC: 221 ug/dL — AB (ref 250–450)
UIBC: 164 ug/dL

## 2016-01-22 LAB — FERRITIN: FERRITIN: 136 ng/mL (ref 24–336)

## 2016-01-22 MED ORDER — TRAZODONE HCL 50 MG PO TABS
50.0000 mg | ORAL_TABLET | Freq: Every day | ORAL | Status: DC
  Administered 2016-01-22: 50 mg via ORAL
  Filled 2016-01-22: qty 1

## 2016-01-22 MED ORDER — ENOXAPARIN SODIUM 40 MG/0.4ML ~~LOC~~ SOLN
40.0000 mg | SUBCUTANEOUS | Status: DC
  Administered 2016-01-22: 40 mg via SUBCUTANEOUS
  Filled 2016-01-22: qty 0.4

## 2016-01-22 NOTE — Consult Note (Signed)
Consultation Note Date: 01/22/2016   Patient Name: Antonio Duran  DOB: 05/01/1941  MRN: [id number]  Age / Sex: 75 y.o., male  PCP: Antonio BannerFred H Wilson, MD Referring Physician: [name], MD  Reason for Consultation: Non pain symptom management  HPI/Patient Profile: 75 y.o. male  with past medical history of Alzheimer's dementia admitted on 01/18/2016 with mental status changes and behavioral disturbances. He was previously diagnosed by PCP with UTI and abx were started, he was admitted for further workup of mental status changes.  Clinical Assessment and Goals of Care: Assessment is complicated by lack of medical records and health history to review. Upon evaluation patient was sitting in chair. He reported no pain. He was calm during exam but visibly tense. Oriented to person, city- but not building, no time orientation. He stated he lived in this building with his wife who is an excellent housekeeper. Expressed agitation with other people's homes being "full of filth" using expletives, however, was satisfied with his own current surroundings. Easily redirected. Per RN report he continues to try and get up out of the bed, but appears calmer today. He has a tendency for sundowning, and does not sleep well. Chart review reveals increasing behavioral and psychological symptoms of dementia at home. Questionable etiology- UTI vs. Progressing dementia, with high suspicion of advancing dementia. There are concerns from family about maintaining patient's and their own safety if behavioral symptoms cannot be resolved. Patient was living at home, and currently the plan is for him to return there at discharge. I have scheduled a family meeting tomorrow to gain further insight into patient's behaviors and help clarify goals of care with family.   NEXT OF KIN- Son- Antonio Duran   SUMMARY OF RECOMMENDATIONS -Sundowning, behavior  disturbance: start Trazadone  50mg  qhs -Recommend prn thorazine 25mg  IM for severe aggressive behaviors - If there are no concerns for QT elongation, consider adding citalopram 10mg  PO daily- I am hesitant to start this without full history- would recommend EKG before starting -with onset of behavior disturbances related to dementia recommend primary consider tapering and discontinuing Namenda and Aricept at discharge -Family meeting scheduled for tomorrow to discuss goals of care -Nonpharmacological recommendations include: aromatherapy with lavender oil, music therapy, PT consult for regular physical exercise and stimulation, caregiver education re: caring for patient's with dementia and behavioral disturbances -Recommend palliative services at home at discharge for family education, support and further goals    Code Status/Advance Care Planning:  Full code     Symptom Management:   See summary of recommendations above  Palliative Prophylaxis:   Delirium Protocol  Additional Recommendations (Limitations, Scope, Preferences):  Full Scope Treatment  Psycho-social/Spiritual:   Desire for further Chaplaincy support:No Additional Recommendations: none  Prognosis:   Unable to determine  Discharge Planning: Home with Palliative Services      Primary Diagnoses: Present on Admission: . Enterococcus UTI . Confusion . Dementia with behavioral disturbance   I have reviewed the medical record, interviewed the patient and family, and examined the patient. The  following aspects are pertinent.  Past Medical History:  Diagnosis Date  . Alzheimer's dementia    Social History   Social History  . Marital status: Married    Spouse name: N/A  . Number of children: N/A  . Years of education: N/A   Social History Main Topics  . Smoking status: Former Games developer  . Smokeless tobacco: Never Used  . Alcohol use No  . Drug use: No  . Sexual activity: Not Asked   Other Topics  Concern  . None   Social History Narrative  . None   Family History  Problem Relation Age of Onset  . Dementia Sister    Scheduled Meds: . amoxicillin  1,000 mg Oral Q12H  . donepezil  10 mg Oral Daily  . enoxaparin (LOVENOX) injection  40 mg Subcutaneous Q24H  . memantine  28 mg Oral Daily  . QUEtiapine  50 mg Oral BID   Continuous Infusions:  PRN Meds:.LORazepam **OR** LORazepam Medications Prior to Admission:  Prior to Admission medications   Medication Sig Start Date End Date Taking? Authorizing Provider  donepezil (ARICEPT) 10 MG tablet Take 10 mg by mouth daily.   Yes Historical Provider, MD  memantine (NAMENDA XR) 28 MG CP24 24 hr capsule Take 28 mg by mouth.   Yes Historical Provider, MD   No Known Allergies Review of Systems  Unable to perform ROS: Dementia    Physical Exam  Constitutional: He appears well-developed and well-nourished.  HENT:  Head: Normocephalic and atraumatic.  Eyes: EOM are normal.  Pinpoint pupils  Cardiovascular: Normal rate, regular rhythm and normal heart sounds.   Pulmonary/Chest: Effort normal and breath sounds normal.  Abdominal: Soft. Bowel sounds are normal. He exhibits no distension.  Musculoskeletal: Normal range of motion.  Neurological: He is alert.  Oriented to person only  Skin: Skin is warm and dry.  Psychiatric:  Mood appeared tense, confabulating at times    Vital Signs: BP 133/77 (BP Location: Left Arm)   Pulse 70   Temp 98 F (36.7 C) (Oral)   Resp 20   Ht 5\' 9"  (1.753 m)   Wt 62.5 kg (137 lb 11.2 oz)   SpO2 98%   BMI 20.33 kg/m  Pain Assessment: No/denies pain   Pain Score: 0-No pain   SpO2: SpO2: 98 % O2 Device:SpO2: 98 % O2 Flow Rate: .   IO: Intake/output summary:  Intake/Output Summary (Last 24 hours) at 01/22/16 1553 Last data filed at 01/22/16 1320  Gross per 24 hour  Intake              700 ml  Output                0 ml  Net              700 ml    LBM: Last BM Date:  (PTA) Baseline  Weight: Weight: 62.5 kg (137 lb 11.2 oz) Most recent weight: Weight: 62.5 kg (137 lb 11.2 oz)     Palliative Assessment/Data: PPS: 50%   Flowsheet Rows   Flowsheet Row Most Recent Value  Intake Tab  Referral Department  Hospitalist  Unit at Time of Referral  Med/Surg Unit  Palliative Care Primary Diagnosis  Neurology  Date Notified  01/22/16  Palliative Care Type  New Palliative care  Reason for referral  Non-pain Symptom  Date of Admission  01/18/16  # of days IP prior to Palliative referral  4  Clinical Assessment  Psychosocial & Spiritual  Assessment  Palliative Care Outcomes     Thank you for this consult. Will continue to follow and assist with symptom management and goals of care.   Time In: 1400 Time Out: 1530 Time Total: 90 mins Greater than 50%  of this time was spent counseling and coordinating care related to the above assessment and plan.  Signed by: Ocie Bob, AGNP-C Palliative Medicine    Please contact Palliative Medicine Team phone at [phone] for questions and concerns.  For individual provider: See Loretha Stapler

## 2016-01-22 NOTE — NC FL2 (Signed)
  [place] MEDICAID FL2 LEVEL OF CARE SCREENING TOOL     IDENTIFICATION  Patient Name: Antonio Duran Birthdate: 03/26/1941 Sex: male Admission Date (Current Location): 01/18/2016  [facility] [place]   Facility and Address:  [place], [address], [place], [place] [address]      Provider Number: [id number]  Attending Physician Name and Address:  [name], MD  Relative Name and Phone Number:       Current Level of Care: Hospital Recommended Level of Care: Skilled Nursing Facility Prior Approval Number:    Date Approved/Denied:   PASRR Number: [id number]  Discharge Plan: SNF    Current Diagnoses: Patient Active Problem List   Diagnosis Date Noted  . Enterococcus UTI 01/18/2016  . Confusion 01/18/2016  . Dementia with behavioral disturbance 01/18/2016    Orientation RESPIRATION BLADDER Height & Weight     Self  Normal External catheter Weight: 62.5 kg (137 lb 11.2 oz) Height:  5\' 9"  (175.3 cm)  BEHAVIORAL SYMPTOMS/MOOD NEUROLOGICAL BOWEL NUTRITION STATUS      Continent Diet (see DC summary)  AMBULATORY STATUS COMMUNICATION OF NEEDS Skin   Limited Assist Verbally Normal                       Personal Care Assistance Level of Assistance  Bathing, Dressing Bathing Assistance: Limited assistance   Dressing Assistance: Limited assistance     Functional Limitations Info             SPECIAL CARE FACTORS FREQUENCY  PT (By licensed PT), OT (By licensed OT)     PT Frequency: 5/wk OT Frequency: 5/wk            Contractures      Additional Factors Info  Code Status, Allergies, Psychotropic Code Status Info: FULL Allergies Info: NKA Psychotropic Info: seroquel         Current Medications (01/22/2016):  This is the current hospital active medication list Current Facility-Administered Medications  Medication Dose Route Frequency Provider Last Rate Last Dose  . amoxicillin (AMOXIL) capsule  1,000 mg  1,000 mg Oral Q12H Hillary BowJared M Gardner, DO   1,000 mg at 01/21/16 2026  . donepezil (ARICEPT) tablet 10 mg  10 mg Oral Daily Hillary BowJared M Gardner, DO   10 mg at 01/21/16 [id number]  . heparin injection 5,000 Units  5,000 Units Subcutaneous Q8H Hillary BowJared M Gardner, DO   5,000 Units at 01/22/16 0505  . LORazepam (ATIVAN) tablet 1 mg  1 mg Oral Q6H PRN [name], MD   1 mg at 01/21/16 2026   Or  . LORazepam (ATIVAN) injection 1 mg  1 mg Intramuscular Q6H PRN [name], MD      . memantine (NAMENDA XR) 24 hr capsule 28 mg  28 mg Oral Daily Hillary BowJared M Gardner, DO   28 mg at 01/21/16 [id number]  . QUEtiapine (SEROQUEL) tablet 50 mg  50 mg Oral BID [name], MD   50 mg at 01/21/16 1841     Discharge Medications: Please see discharge summary for a list of discharge medications.  Relevant Imaging Results:  Relevant Lab Results:   Additional Information SS#: [id number]  Burna SisUris, Stacey Maura H, LCSW

## 2016-01-22 NOTE — Plan of Care (Signed)
Problem: Education: Goal: Knowledge of [facility] Education information/materials will improve Outcome: Not Progressing Dementia patient refusing teaching.  Problem: Health Behavior/Discharge Planning: Goal: Ability to manage health-related needs will improve Outcome: Not Progressing Dementia patient refusing teaching.

## 2016-01-22 NOTE — Progress Notes (Signed)
PROGRESS NOTE    Antonio DomCharlie Meuth  ZOX:[id number] DOB: 05/01/1941 DOA: 01/18/2016 PCP: Pamelia HoitWILSON,FRED HENRY, MD   Brief Narrative: Antonio Duran is a 75 y.o. male with medical history significant of Alzheimers dementia. Patient brought in by family to the ED with gradual onset of increased confusion, combativeness, inappropriate behavior at home.  Patient had been brought in to PCP on 9/5 with urinary frequency complaints.  Started on cipro for presumed UTI.  Culture just came back this evening after patient was already in the ED and is now showing enterococcus.   Assessment & Plan:   Principal Problem:   Confusion Active Problems:   Enterococcus UTI   Dementia with behavioral disturbance   Confusion/acute encephalopathy Possibly secondary to infection vs progression of dementia with episodes of delirium. Leaning more towards dementia. Patient appears to have regressed some last night. It is getting harder to reorient. I have not seen family in a while. -Increased to Seroquel 50mg  BID -ativan 1mg  q6hrs prn -sitter when available -avoid restraints as much as possible -palliative care consult for symptom management  Enterococcus in urine Likely colonization. Possible UTI with current ureteral disturbance. -continue amoxicillin  Dementia  -Continue donepezil and memantine -Continue seroquel 25mg  BID  AKI Unknown baseline. Stable  Anemia Unknown if chronic. Normocytic. No evidence of bleeding.  -Follow-up iron panel. -Follow CBC -FOBT  DVT prophylaxis: lovenox subq Code Status: Full Code Family Communication: No family at bedside Disposition Plan: Likely discharge to SNF when workup complete   Consultants:   None  Procedures:   None  Antimicrobials:  Amoxicillin (9/7>>   Subjective: Patient was sleeping and stated "his bed feels good."  Objective: Vitals:   01/21/16 0551 01/21/16 1357 01/21/16 2059 01/22/16 0658  BP: 132/88 (!) 141/84 (!) 153/99 (!)  150/81  Pulse: (!) 57 75 93 (!) 52  Resp: 18  18 18   Temp: 98 F (36.7 C) 98.9 F (37.2 C) 98.5 F (36.9 C) 98.2 F (36.8 C)  TempSrc: Oral Oral    SpO2: 98% 100% 97% 100%  Weight:      Height:        Intake/Output Summary (Last 24 hours) at 01/22/16 1130 Last data filed at 01/22/16 0524  Gross per 24 hour  Intake              820 ml  Output              900 ml  Net              -80 ml   Filed Weights   01/18/16 2052  Weight: 62.5 kg (137 lb 11.2 oz)    Examination:  General exam: Appears calm and comfortable. Sleeping but easy to arouse  Respiratory system: Clear to auscultation. Respiratory effort normal. Cardiovascular system: S1 & S2 heard, RRR. No murmurs, rubs, gallops or clicks Gastrointestinal system: Abdomen is nondistended, soft and nontender. No organomegaly or masses felt. Normal bowel sounds heard. Central nervous system: Alert and oriented x1. No focal neurological deficits. Extremities: No edema or cyanosis Psychiatry: Judgement and insight impaired. Mood & affect appear normal and flat.     Data Reviewed: I have personally reviewed following labs and imaging studies  CBC:  Recent Labs Lab 01/18/16 1428 01/21/16 0849 01/22/16 0503  WBC 6.9 6.0 4.9  HGB 10.1* 9.1* 9.4*  HCT 32.3* 29.1* 29.9*  MCV 90.0 89.3 89.3  PLT 342 308 308   Basic Metabolic Panel:  Recent Labs Lab 01/18/16 1428 01/19/16 0640 01/20/16  0546 01/21/16 0849  NA 144 141 144 143  K 5.5* 4.1 4.6 4.0  CL 113* 113* 115* 115*  CO2 24 21* 23 24  GLUCOSE 129* 82 64* 92  BUN 24* 16 13 13   CREATININE 1.97* 1.57* 1.33* 1.43*  CALCIUM 9.4 9.0 9.1 8.9   GFR: Estimated Creatinine Clearance: 40.1 mL/min (by C-G formula based on SCr of 1.43 mg/dL). Liver Function Tests:  Recent Labs Lab 01/18/16 1428  AST 18  ALT 12*  ALKPHOS 69  BILITOT 0.5  PROT 6.5  ALBUMIN 3.1*   No results for input(s): LIPASE, AMYLASE in the last 168 hours. No results for input(s): AMMONIA in  the last 168 hours. Coagulation Profile: No results for input(s): INR, PROTIME in the last 168 hours. Cardiac Enzymes: No results for input(s): CKTOTAL, CKMB, CKMBINDEX, TROPONINI in the last 168 hours. BNP (last 3 results) No results for input(s): PROBNP in the last 8760 hours. HbA1C: No results for input(s): HGBA1C in the last 72 hours. CBG:  Recent Labs Lab 01/20/16 0825  GLUCAP 74   Lipid Profile: No results for input(s): CHOL, HDL, LDLCALC, TRIG, CHOLHDL, LDLDIRECT in the last 72 hours. Thyroid Function Tests: No results for input(s): TSH, T4TOTAL, FREET4, T3FREE, THYROIDAB in the last 72 hours. Anemia Panel:  Recent Labs  01/22/16 0503  FERRITIN 136  TIBC 221*  IRON 57   Sepsis Labs: No results for input(s): PROCALCITON, LATICACIDVEN in the last 168 hours.  No results found for this or any previous visit (from the past 240 hour(s)).       Radiology Studies: No results found.      Scheduled Meds: . amoxicillin  1,000 mg Oral Q12H  . donepezil  10 mg Oral Daily  . enoxaparin (LOVENOX) injection  40 mg Subcutaneous Q24H  . memantine  28 mg Oral Daily  . QUEtiapine  50 mg Oral BID   Continuous Infusions:     LOS: 3 days     Jacquelin Hawking, MD [facility] 01/22/2016, 11:30 AM Pager: [phone]  If 7PM-7AM, please contact night-coverage [email or web address] Password [email or web address] 01/22/2016, 11:30 AM

## 2016-01-22 NOTE — Progress Notes (Signed)
[facility] for possible admission- CSW will continue to follow for admission when appropriate- pt still requiring mitts today- will need to be without restraints for 24 hours prior to DC  Burna SisJenna H. Kaylamarie Swickard, Theresia MajorsLCSWA Clinical Social Worker [phone]

## 2016-01-22 NOTE — Progress Notes (Signed)
[employer] Medicare has denied SNF stay for patient- family informed and agreeable to take patient home at DC- [facility] working [facility] for [facility] care  [facility]  CSW signing off  Burna SisJenna H. Axcel Horsch, [facility] LL[facility]SWA Clinical Social Worker [phone]

## 2016-01-22 NOTE — Care Management Important Message (Signed)
Important Message  Patient Details  Name: Antonio Duran MRN: [id number] Date of Birth: 01/26/1941   Medicare Important Message Given:  Yes    Kathe Wirick Stefan ChurchBratton 01/22/2016, 2:27 PM

## 2016-01-23 DIAGNOSIS — G309 Alzheimer's disease, unspecified: Secondary | ICD-10-CM

## 2016-01-23 DIAGNOSIS — F0280 Dementia in other diseases classified elsewhere without behavioral disturbance: Secondary | ICD-10-CM

## 2016-01-23 DIAGNOSIS — F0150 Vascular dementia without behavioral disturbance: Secondary | ICD-10-CM

## 2016-01-23 LAB — BASIC METABOLIC PANEL
Anion gap: 7 (ref 5–15)
BUN: 19 mg/dL (ref 6–20)
CALCIUM: 9.3 mg/dL (ref 8.9–10.3)
CO2: 23 mmol/L (ref 22–32)
CREATININE: 1.49 mg/dL — AB (ref 0.61–1.24)
Chloride: 111 mmol/L (ref 101–111)
GFR calc non Af Amer: 44 mL/min — ABNORMAL LOW (ref >60)
GFR, EST AFRICAN AMERICAN: 52 mL/min — AB (ref >60)
Glucose, Bld: 89 mg/dL (ref 65–99)
Potassium: 4.5 mmol/L (ref 3.5–5.1)
SODIUM: 141 mmol/L (ref 135–145)

## 2016-01-23 LAB — CBC
HCT: 34.4 % — ABNORMAL LOW (ref 39.0–52.0)
Hemoglobin: 10.8 g/dL — ABNORMAL LOW (ref 13.0–17.0)
MCH: 28.3 pg (ref 26.0–34.0)
MCHC: 31.4 g/dL (ref 30.0–36.0)
MCV: 90.1 fL (ref 78.0–100.0)
PLATELETS: 316 10*3/uL (ref 150–400)
RBC: 3.82 MIL/uL — ABNORMAL LOW (ref 4.22–5.81)
RDW: 15 % (ref 11.5–15.5)
WBC: 5 10*3/uL (ref 4.0–10.5)

## 2016-01-23 LAB — GLUCOSE, CAPILLARY: GLUCOSE-CAPILLARY: 69 mg/dL (ref 65–99)

## 2016-01-23 MED ORDER — CITALOPRAM HYDROBROMIDE 20 MG PO TABS: 10.0000 mg | ORAL_TABLET | Freq: Every day | ORAL | Status: DC

## 2016-01-23 MED ORDER — TRAZODONE HCL 50 MG PO TABS: 50.0000 mg | ORAL_TABLET | Freq: Every day | ORAL | 0 refills | Status: DC

## 2016-01-23 MED ORDER — CITALOPRAM HYDROBROMIDE 10 MG PO TABS: 10.0000 mg | ORAL_TABLET | Freq: Every day | ORAL | 0 refills | Status: DC

## 2016-01-23 MED ORDER — AMOXICILLIN 500 MG PO CAPS: 1000.0000 mg | ORAL_CAPSULE | Freq: Two times a day (BID) | ORAL | 0 refills | Status: AC

## 2016-01-23 MED ORDER — QUETIAPINE FUMARATE 50 MG PO TABS: 50.0000 mg | ORAL_TABLET | Freq: Two times a day (BID) | ORAL | 0 refills | Status: DC

## 2016-01-23 NOTE — Discharge Instructions (Addendum)
Antonio Duran, we admitted you because you had a change in your mental status. This is likely secondary to your underlying dementia. We adjusted your medications and had palliative care meeting with you. You're also found to have a urinary tract infection. I would like you to continue treatment while outside the hospital for an extra 5 days. Please complete your prescription. We have set up for you to receive home health services which include nurse, physical therapy, occupational therapy, social work, home aide, palliative care.

## 2016-01-23 NOTE — Discharge Summary (Addendum)
Physician Discharge Summary  Antonio Duran WUJ:[id number] DOB: Sep 22, 1940 DOA: 01/18/2016  PCP: Pamelia Hoit, MD  Admit date: 01/18/2016 Discharge date: 01/23/2016  Admitted From: Home Disposition:  Home  Recommendations for Outpatient Follow-up:  1. Follow up with PCP in 1 week 2. Repeat CBC in one week to check that hemoglobin is stable 3. Recheck BMP in one week  to check that creatinine is stable 4. Consider discontinuing donepezil and memantine since patient appears to be declining 5. Seroquel, Trazodone and Celexa added to patient's regimen. Recommend repeat EKG for assessment of QT prolongation  Home Health: Yes, with palliative care Equipment/Devices: None  Discharge Condition: Stable CODE STATUS:  DNR Diet recommendation: Heart Healthy  Brief/Interim Summary: Brief Narrative: Taden Bullardis a 75 y.o.malewith medical history significant of Alzheimers dementia. Patient brought in by family to the ED with gradual onset of increased confusion, combativeness, inappropriate behavior at home. Patient had been brought in to PCP on 9/5 with urinary frequency complaints. Started on cipro for presumed UTI. Culture just came back this evening after patient was already in the ED and is now showing enterococcus. Symptoms improved over admission and services provided to family for outpatient management.  Discharge Diagnoses:  Principal Problem:   Confusion Active Problems:   Enterococcus UTI   Dementia with behavioral disturbance   Palliative care by specialist   Agitation   Insomnia   Mixed dementia  Confusion/acute encephalopathy Patient initially admitted with concern for confusion secondary to UTI vs dementia with delirium. CT head negative for acute process. Patient was hydrated without much improvement. Patient needed restraints and a sitter secondary to continually getting up from bed as a fall risk. UTI was treated with amoxicillin. He was started on regimen of  Seroquel, citalopram and trazodone, which helped symptoms. Palliative care was consulted for behvarioal management. Patient was made DNR and plan for outpatient management made with family. Attempted to try to get patient to a SNF, however, this could not be done secondary to insurance.  Enterococcus in urine Likely colonization however, was treated since patient was acutely altered. He was treated with amoxicillin to be continued outpatient  Dementia  Likely contributing a lot to altered mental status. Donepezil and memantine continued  AKI Unknown baseline. Stable  Anemia Unknown if chronic. Normocytic. No evidence of bleeding.   Discharge Instructions  Discharge Instructions    Diet - low sodium heart healthy    Complete by:  As directed   Increase activity slowly    Complete by:  As directed       Medication List    TAKE these medications   amoxicillin 500 MG capsule Commonly known as:  AMOXIL Take 2 capsules (1,000 mg total) by mouth every 12 (twelve) hours.   citalopram 10 MG tablet Commonly known as:  CELEXA Take 1 tablet (10 mg total) by mouth daily.   donepezil 10 MG tablet Commonly known as:  ARICEPT Take 10 mg by mouth daily.   NAMENDA XR 28 MG Cp24 24 hr capsule Generic drug:  memantine Take 28 mg by mouth.   QUEtiapine 50 MG tablet Commonly known as:  SEROQUEL Take 1 tablet (50 mg total) by mouth 2 (two) times daily.   traZODone 50 MG tablet Commonly known as:  DESYREL Take 1 tablet (50 mg total) by mouth at bedtime.      Follow-up Information    [facility] .   Specialty:  Home Health Services Why:  For home health. They will call 1-2 days  after discharge to set up your first home visit.  Contact information: [address] [place] [address] [phone]        Pamelia Hoit, MD. Schedule an appointment as soon as possible for a visit in 1 week(s).   Specialty:  Family Medicine Why:  Hospital follow-up Contact  information: [address] [place] [address] [phone]          No Known Allergies  Consultations:  Palliative care  Physical therapy   Procedures/Studies: Ct Head Wo Contrast  Result Date: 01/18/2016 CLINICAL DATA:  Dementia.  Behavioral disturbance. EXAM: CT HEAD WITHOUT CONTRAST TECHNIQUE: Contiguous axial images were obtained from the base of the skull through the vertex without intravenous contrast. COMPARISON:  None. FINDINGS: Brain: The brainstem, cerebellum, cerebral peduncles, thalami, basal ganglia, basilar cisterns, and ventricular system appear within normal limits. Periventricular white matter and corona radiata hypodensities favor chronic ischemic microvascular white matter disease. No intracranial hemorrhage, mass lesion, or acute CVA. Vascular: Mild atherosclerosis. Skull: Unremarkable Sinuses/Orbits: Unremarkable where included. Other: No supplemental non-categorized findings. IMPRESSION: 1. No acute intracranial findings. 2. Periventricular white matter and corona radiata hypodensities favor chronic ischemic microvascular white matter disease. Electronically Signed   By: Gaylyn Rong M.D.   On: 01/18/2016 20:42      Subjective: No concerns today.  Discharge Exam: Vitals:   01/23/16 0522 01/23/16 0617  BP: (!) 167/100 133/72  Pulse: 98 60  Resp: 18   Temp: 97.4 F (36.3 C)    Vitals:   01/22/16 0658 01/22/16 1341 01/23/16 0522 01/23/16 0617  BP: (!) 150/81 133/77 (!) 167/100 133/72  Pulse: (!) 52 70 98 60  Resp: 18 20 18    Temp: 98.2 F (36.8 C) 98 F (36.7 C) 97.4 F (36.3 C)   TempSrc:  Oral    SpO2: 100% 98% 100%   Weight:      Height:        General exam: Appears calm and comfortable . Sleeping and easily arousable Respiratory system: Clear to auscultation. Respiratory effort normal. Cardiovascular system: S1 & S2 heard, RRR. No murmurs, rubs, gallops or clicks Gastrointestinal system: Abdomen is nondistended, soft and  nontender. No organomegaly or masses felt. Normal bowel sounds heard. Central nervous system: Alert and oriented x1. No focal neurological deficits. Extremities: No edema or cyanosis Psychiatry: Judgement and insight impaired. Mood & affect appear normal and flat.    The results of significant diagnostics from this hospitalization (including imaging, microbiology, ancillary and laboratory) are listed below for reference.     Microbiology: No results found for this or any previous visit (from the past 240 hour(s)).   Labs: BNP (last 3 results) No results for input(s): BNP in the last 8760 hours. Basic Metabolic Panel:  Recent Labs Lab 01/18/16 1428 01/19/16 0640 01/20/16 0546 01/21/16 0849 01/23/16 1024  NA 144 141 144 143 141  K 5.5* 4.1 4.6 4.0 4.5  CL 113* 113* 115* 115* 111  CO2 24 21* 23 24 23   GLUCOSE 129* 82 64* 92 89  BUN 24* 16 13 13 19   CREATININE 1.97* 1.57* 1.33* 1.43* 1.49*  CALCIUM 9.4 9.0 9.1 8.9 9.3   Liver Function Tests:  Recent Labs Lab 01/18/16 1428  AST 18  ALT 12*  ALKPHOS 69  BILITOT 0.5  PROT 6.5  ALBUMIN 3.1*   No results for input(s): LIPASE, AMYLASE in the last 168 hours. No results for input(s): AMMONIA in the last 168 hours. CBC:  Recent Labs Lab  01/18/16 1428 01/21/16 0849 01/22/16 0503 01/23/16 1024  WBC 6.9 6.0 4.9 5.0  HGB 10.1* 9.1* 9.4* 10.8*  HCT 32.3* 29.1* 29.9* 34.4*  MCV 90.0 89.3 89.3 90.1  PLT 342 308 308 316   Cardiac Enzymes: No results for input(s): CKTOTAL, CKMB, CKMBINDEX, TROPONINI in the last 168 hours. BNP: Invalid input(s): POCBNP CBG:  Recent Labs Lab 01/20/16 0825 01/23/16 0614  GLUCAP 74 69   D-Dimer No results for input(s): DDIMER in the last 72 hours. Hgb A1c No results for input(s): HGBA1C in the last 72 hours. Lipid Profile No results for input(s): CHOL, HDL, LDLCALC, TRIG, CHOLHDL, LDLDIRECT in the last 72 hours. Thyroid function studies No results for input(s): TSH, T4TOTAL,  T3FREE, THYROIDAB in the last 72 hours.  Invalid input(s): FREET3 Anemia work up  Recent Labs  01/22/16 0503  FERRITIN 136  TIBC 221*  IRON 57   Urinalysis    Component Value Date/Time   COLORURINE YELLOW 01/18/2016 1444   APPEARANCEUR HAZY (A) 01/18/2016 1444   LABSPEC 1.026 01/18/2016 1444   PHURINE 5.0 01/18/2016 1444   GLUCOSEU NEGATIVE 01/18/2016 1444   HGBUR NEGATIVE 01/18/2016 1444   BILIRUBINUR SMALL (A) 01/18/2016 1444   KETONESUR NEGATIVE 01/18/2016 1444   PROTEINUR NEGATIVE 01/18/2016 1444   NITRITE NEGATIVE 01/18/2016 1444   LEUKOCYTESUR MODERATE (A) 01/18/2016 1444   Sepsis Labs Invalid input(s): PROCALCITONIN,  WBC,  LACTICIDVEN Microbiology No results found for this or any previous visit (from the past 240 hour(s)).   Time coordinating discharge: Over 30 minutes  SIGNED:   Jacquelin Hawkingalph Nettey, MD  [facility] 01/23/2016, 2:55 PM Pager Jacquelin Hawkingalph Nettey, MD [facility] 01/29/2016, 11:51 AM Pager: [phone]  If 7PM-7AM, please contact night-coverage [email or web address] Password [email or web address]

## 2016-01-23 NOTE — Progress Notes (Addendum)
Physical Therapy Treatment Patient Details Name: Antonio Duran Alber MRN: [id number] DOB: 03/20/1941 Today's Date: 01/23/2016    History of Present Illness Patient is a 75 yo male admitted 01/18/16 with increased confusion.  Patient with UTI, AKI.    PMH:  Alzheimers dementia with behavioral disturbance,     PT Comments    Patient demonstrated increased balance impairments this session and required +2 assist for ambulation. Pt oriented to person. If denied SNF placement, pt will need 24 hour assistance/supervision for safety as pt lacks safety/deficit awareness and is high fall risk.   Follow Up Recommendations  SNF;Supervision/Assistance - 24 hour (Or Memory Care Unit with PT available)     Equipment Recommendations  None recommended by PT    Recommendations for Other Services       Precautions / Restrictions Precautions Precautions: Fall Precaution Comments: confusion Restrictions Weight Bearing Restrictions: No    Mobility  Bed Mobility Overal bed mobility: Needs Assistance Bed Mobility: Supine to Sit     Supine to sit: Supervision     General bed mobility comments: supervision for safety; multimodal cues to initiate and attend to task  Transfers Overall transfer level: Needs assistance Equipment used: None Transfers: Sit to/from Stand Sit to Stand: Min guard;Min assist         General transfer comment: min guard to stand and min A with HHA +2 upon standing due to unsteadiness  Ambulation/Gait Ambulation/Gait assistance: Mod assist;+2 physical assistance Ambulation Distance (Feet): 200 Feet Assistive device: 2 person hand held assist Gait Pattern/deviations: Step-through pattern;Decreased stride length;Scissoring;Staggering left;Staggering right;Trunk flexed;Narrow base of support     General Gait Details: pt very unsteady and with several LOB and heavy L lateral lean; +2 assist required for balance; pt with scissoring gait at times and with flexed trunk and  bilat knees at times   Stairs            Wheelchair Mobility    Modified Rankin (Stroke Patients Only)       Balance             Standing balance-Leahy Scale: Poor                      Cognition Arousal/Alertness: Awake/alert Behavior During Therapy: Restless;Impulsive Overall Cognitive Status: Impaired/Different from baseline Area of Impairment: Orientation;Memory;Following commands;Safety/judgement;Awareness;Problem solving Orientation Level: Disoriented to;Time;Situation;Place   Memory: Decreased short-term memory Following Commands: Follows one step commands inconsistently;Follows one step commands with increased time Safety/Judgement: Decreased awareness of safety;Decreased awareness of deficits Awareness: Intellectual Problem Solving: Difficulty sequencing;Requires verbal cues;Requires tactile cues      Exercises      General Comments        Pertinent Vitals/Pain Pain Assessment: No/denies pain    Home Living                      Prior Function            PT Goals (current goals can now be found in the care plan section) Acute Rehab PT Goals Patient Stated Goal: none stated PT Goal Formulation: Patient unable to participate in goal setting Time For Goal Achievement: 01/28/16 Potential to Achieve Goals: Fair Progress towards PT goals: Not progressing toward goals - comment    Frequency  Min 2X/week    PT Plan Current plan remains appropriate    Co-evaluation             End of Session Equipment Utilized During Treatment: Gait belt Activity  Tolerance: Patient tolerated treatment well Patient left: in bed;with call bell/phone within reach;with bed alarm set (4 rails up)     Time: 1610-9604 PT Time Calculation (min) (ACUTE ONLY): 24 min  Charges:  $Gait Training: 8-22 mins $Therapeutic Activity: 8-22 mins                    G Codes:      Derek Mound, PTA Pager: [phone]   01/23/2016, 3:40 PM

## 2016-01-23 NOTE — Progress Notes (Signed)
Discharge instructions, follow up appts and RXs reviewed and provided to patient's son verbalized understanding. Patient left floor via wheelchair accompanied by staff no c/o pain or shortness of breath at d/c. Antonio Duran, Kae HellerMiranda Lynn, RN
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
## 2016-02-20 ENCOUNTER — Emergency Department (HOSPITAL_COMMUNITY)
Admission: EM | Admit: 2016-02-20 | Discharge: 2016-02-21 | Disposition: A | Discharge status: Home / Self Care | Payer: Medicare HMO | Attending: Emergency Medicine | Admitting: Emergency Medicine

## 2016-02-20 ENCOUNTER — Emergency Department (HOSPITAL_COMMUNITY): Payer: Medicare HMO

## 2016-02-20 DIAGNOSIS — S0990XA Unspecified injury of head, initial encounter: Secondary | ICD-10-CM | POA: Diagnosis present

## 2016-02-20 DIAGNOSIS — Y9301 Activity, walking, marching and hiking: Secondary | ICD-10-CM | POA: Insufficient documentation

## 2016-02-20 DIAGNOSIS — G309 Alzheimer's disease, unspecified: Secondary | ICD-10-CM | POA: Insufficient documentation

## 2016-02-20 DIAGNOSIS — S161XXA Strain of muscle, fascia and tendon at neck level, initial encounter: Secondary | ICD-10-CM | POA: Insufficient documentation

## 2016-02-20 DIAGNOSIS — Y9289 Other specified places as the place of occurrence of the external cause: Secondary | ICD-10-CM | POA: Diagnosis not present

## 2016-02-20 DIAGNOSIS — W1800XA Striking against unspecified object with subsequent fall, initial encounter: Secondary | ICD-10-CM | POA: Insufficient documentation

## 2016-02-20 DIAGNOSIS — S60812A Abrasion of left wrist, initial encounter: Secondary | ICD-10-CM | POA: Insufficient documentation

## 2016-02-20 DIAGNOSIS — Y999 Unspecified external cause status: Secondary | ICD-10-CM | POA: Diagnosis not present

## 2016-02-20 DIAGNOSIS — W19XXXA Unspecified fall, initial encounter: Secondary | ICD-10-CM

## 2016-02-20 DIAGNOSIS — Z79899 Other long term (current) drug therapy: Secondary | ICD-10-CM | POA: Diagnosis not present

## 2016-02-20 DIAGNOSIS — Z87891 Personal history of nicotine dependence: Secondary | ICD-10-CM | POA: Diagnosis not present

## 2016-02-20 LAB — I-STAT CHEM 8, ED
BUN: 31 mg/dL — AB (ref 6–20)
CALCIUM ION: 1.17 mmol/L (ref 1.15–1.40)
Chloride: 111 mmol/L (ref 101–111)
Creatinine, Ser: 1.4 mg/dL — ABNORMAL HIGH (ref 0.61–1.24)
Glucose, Bld: 74 mg/dL (ref 65–99)
HEMATOCRIT: 26 % — AB (ref 39.0–52.0)
HEMOGLOBIN: 8.8 g/dL — AB (ref 13.0–17.0)
Potassium: 5.1 mmol/L (ref 3.5–5.1)
SODIUM: 143 mmol/L (ref 135–145)
TCO2: 28 mmol/L (ref 0–100)

## 2016-02-20 NOTE — Discharge Instructions (Signed)

## 2016-02-20 NOTE — ED Notes (Signed)
Bed: JY78WA05 Expected date:  Expected time:  Means of arrival:  Comments: 75 yr old fall, head injury

## 2016-02-20 NOTE — ED Provider Notes (Signed)
Emergency Department Provider Note   I have reviewed the triage vital signs and the nursing notes.   HISTORY  Chief Complaint Fall   HPI Antonio Duran is a 75 y.o. male with PMH of dementia presents to the ED for evaluation of fall that was witnessed by staff at [facility]. The patient states that he was walking and fell. Staff report positive head injury with no loss of consciousness. He is also noted to have some abrasions to the left wrist but does not recall how this happened. No history of blood thinners. Denies EtOH or drug use. No seizure history or report of seizure activity on scene.   HPI and ROS limited by baseline dementia.   Past Medical History:  Diagnosis Date  . Alzheimer's dementia     Patient Active Problem List   Diagnosis Date Noted  . Mixed dementia   . Palliative care by specialist   . Agitation   . Insomnia   . Enterococcus UTI 01/18/2016  . Confusion 01/18/2016  . Dementia with behavioral disturbance 01/18/2016    No past surgical history on file.  Current Outpatient Rx  . Order #: [id number] Class: Historical Med  . Order #: [id number] Class: Historical Med  . Order #: [id number] Class: Normal  . Order #: [id number] Class: Historical Med  . Order #: [id number] Class: Historical Med  . Order #: [id number] Class: Historical Med  . Order #: [id number] Class: Historical Med  . Order #: [id number] Class: Historical Med  . Order #: [id number] Class: Historical Med  . Order #: [id number] Class: Historical Med  . Order #: [id number] Class: Historical Med  . Order #: [id number] Class: Normal  . Order #: [id number] Class: Normal    Allergies Zolpidem  Family History  Problem Relation Age of Onset  . Dementia Sister     Social History Social History  Substance Use Topics  . Smoking status: Former Games developer  . Smokeless tobacco: Never Used  . Alcohol use No    Review of Systems  Limited by Dementia.    ____________________________________________   PHYSICAL EXAM:  VITAL SIGNS: ED Triage Vitals [02/20/16 2136]  Enc Vitals Group     BP (!) 161/112     Pulse Rate 77     Resp 19     Temp 98.9 F (37.2 C)     Temp Source Oral     SpO2 94 %   Constitutional: Drowsy but awaken to voice. Not oriented at baseline.  Eyes: Conjunctivae are normal. PERRL.  Head: Atraumatic. Nose: No congestion/rhinnorhea. Mouth/Throat: Mucous membranes are moist.  Oropharynx non-erythematous. Neck: No stridor.   Cardiovascular: Normal rate, regular rhythm. Good peripheral circulation. Grossly normal heart sounds.   Respiratory: Normal respiratory effort.  No retractions. Lungs CTAB. Gastrointestinal: Soft and nontender. No distention.  Musculoskeletal: No lower extremity tenderness nor edema. No gross deformities of extremities. Neurologic:  Normal speech and language. No gross focal neurologic deficits are appreciated.  Skin:  Skin is warm, dry and intact. Abrasions to the left wrist.    ____________________________________________   LABS (all labs ordered are listed, but only abnormal results are displayed)  Labs Reviewed  I-STAT CHEM 8, ED - Abnormal; Notable for the following:       Result Value   BUN 31 (*)    Creatinine, Ser 1.40 (*)    Hemoglobin 8.8 (*)    HCT 26.0 (*)    All other components within normal limits   ____________________________________________  EKG   EKG Interpretation  Date/Time:  Tuesday February 20 2016 21:37:27  EDT Ventricular Rate:  79 PR Interval:    QRS Duration: 90 QT Interval:  400 QTC Calculation: 459 R Axis:   -16 Text Interpretation:  Sinus rhythm Borderline left axis deviation RSR' in V1 or V2, probably normal variant No STEMI. No V2 lead in old EKG.  Confirmed by Bernetta Sutley MD, Loxley Cibrian [phone] on 02/20/2016 10:20:16 PM Also confirmed by Modesta Sammons MD, Poonam Woehrle [phone]), editor [place]  CCT, [place] ([id number])  on 02/21/2016 8:00:27 AM        ____________________________________________  RADIOLOGY  Ct Head Wo Contrast  Result Date: 02/20/2016 CLINICAL DATA:  Dementia patient post witnessed fall with head injury. Struck head, no loss of consciousness. EXAM: CT HEAD WITHOUT CONTRAST CT CERVICAL SPINE WITHOUT CONTRAST TECHNIQUE: Multidetector CT imaging of the head and cervical spine was performed following the standard protocol without intravenous contrast. Multiplanar CT image reconstructions of the cervical spine were also generated. COMPARISON:  Head CT 01/18/2016 FINDINGS: CT HEAD FINDINGS Brain: No evidence of acute infarction, hemorrhage, hydrocephalus, extra-axial collection or mass lesion/mass effect. The degree of chronic small vessel ischemic change is stable. Vascular: Atherosclerosis of skullbase fast erect. No hyperdense vessel. Skull: Normal. Negative for fracture or focal lesion. Sinuses/Orbits: No acute finding. Other: None. CT CERVICAL SPINE FINDINGS Alignment: Minimal anterolisthesis of C2 on C3, minimal retrolisthesis of C3 on C4, and anterolisthesis of C7 on T1 appears degenerative. No acute traumatic subluxation. Skull base and vertebrae: No fracture. Soft tissues and spinal canal: No prevertebral fluid or swelling. No visible canal hematoma. Disc levels: Multilevel disc space narrowing and endplate spurring, most prominent at C3-C4. Multilevel facet arthropathy throughout. Upper chest: No acute abnormality. Other: None. IMPRESSION: 1. No acute intracranial abnormality. Stable chronic small vessel ischemia. 2. Multilevel degenerative change throughout the cervical spine without acute fracture or subluxation. Electronically Signed   By: Rubye OaksMelanie  Ehinger M.D.   On: 02/20/2016 23:49   Ct Cervical Spine Wo Contrast  Result Date: 02/20/2016 CLINICAL DATA:  Dementia patient post witnessed fall with head injury. Struck head, no loss of consciousness. EXAM: CT HEAD WITHOUT CONTRAST CT CERVICAL SPINE WITHOUT CONTRAST  TECHNIQUE: Multidetector CT imaging of the head and cervical spine was performed following the standard protocol without intravenous contrast. Multiplanar CT image reconstructions of the cervical spine were also generated. COMPARISON:  Head CT 01/18/2016 FINDINGS: CT HEAD FINDINGS Brain: No evidence of acute infarction, hemorrhage, hydrocephalus, extra-axial collection or mass lesion/mass effect. The degree of chronic small vessel ischemic change is stable. Vascular: Atherosclerosis of skullbase fast erect. No hyperdense vessel. Skull: Normal. Negative for fracture or focal lesion. Sinuses/Orbits: No acute finding. Other: None. CT CERVICAL SPINE FINDINGS Alignment: Minimal anterolisthesis of C2 on C3, minimal retrolisthesis of C3 on C4, and anterolisthesis of C7 on T1 appears degenerative. No acute traumatic subluxation. Skull base and vertebrae: No fracture. Soft tissues and spinal canal: No prevertebral fluid or swelling. No visible canal hematoma. Disc levels: Multilevel disc space narrowing and endplate spurring, most prominent at C3-C4. Multilevel facet arthropathy throughout. Upper chest: No acute abnormality. Other: None. IMPRESSION: 1. No acute intracranial abnormality. Stable chronic small vessel ischemia. 2. Multilevel degenerative change throughout the cervical spine without acute fracture or subluxation. Electronically Signed   By: Rubye OaksMelanie  Ehinger M.D.   On: 02/20/2016 23:49    ____________________________________________   PROCEDURES  Procedure(s) performed:   Procedures  None ____________________________________________   INITIAL IMPRESSION / ASSESSMENT AND PLAN / ED COURSE  Pertinent labs & imaging results that were available during my care of the patient  were reviewed by me and considered in my medical decision making (see chart for details).  Patient resents to the emergency department for evaluation of a witnessed fall and head injury. Patient is not anticoagulated. He has  dementia at baseline. Slight abrasion to the left wrist with no focal tenderness to palpation. Full range of motion of all extremities including hips. No evidence of head injury on my external exam. Patient in c-collar. Plan for labs, EKG, CT scan of the head and C-spine.  11:53 PM CT scan of the head and neck reviewed with no acute injury. Plan for discharge back to [facility].   At this time, I do not feel there is any life-threatening condition present. I have reviewed and discussed all results (EKG, imaging, lab, urine as appropriate), exam findings with patient. I have reviewed nursing notes and appropriate previous records.  I feel the patient is safe to be discharged home without further emergent workup. Discussed usual and customary return precautions. Patient and family (if present) verbalize understanding and are comfortable with this plan.  Patient will follow-up with their primary care provider. If they do not have a primary care provider, information for follow-up has been provided to them. All questions have been answered.  ____________________________________________  FINAL CLINICAL IMPRESSION(S) / ED DIAGNOSES  Final diagnoses:  Fall, initial encounter  Injury of head, initial encounter  Strain of neck muscle, initial encounter     Note:  This document was prepared using Dragon voice recognition software and may include unintentional dictation errors.  Alona Bene, MD Emergency Medicine   Maia Plan, MD 02/21/16 [phone]

## 2016-02-20 NOTE — ED Triage Notes (Addendum)
Per EMS, pt is from [facility] with chief complaint being a witnessed fall. Staff at facility and pt reported that pt hit head with no LOC. EMS reports that pt has cuts on left wrist possibly related to a watch being worn. No other injuries noted. Staff stated pt is not on blood thinners. Pt denies pain. Alert and oriented X3 at baseline with hx of dementia.

## 2016-02-21 NOTE — ED Notes (Signed)
[facility] notified for pt transportation.

## 2016-04-04 ENCOUNTER — Encounter (HOSPITAL_COMMUNITY): Payer: Self-pay | Admitting: *Deleted

## 2016-04-04 ENCOUNTER — Emergency Department (HOSPITAL_COMMUNITY)
Admission: EM | Admit: 2016-04-04 | Discharge: 2016-04-04 | Disposition: A | Discharge status: Home / Self Care | Payer: Medicare HMO | Attending: Emergency Medicine | Admitting: Emergency Medicine

## 2016-04-04 DIAGNOSIS — R531 Weakness: Secondary | ICD-10-CM | POA: Diagnosis not present

## 2016-04-04 DIAGNOSIS — Z79899 Other long term (current) drug therapy: Secondary | ICD-10-CM | POA: Insufficient documentation

## 2016-04-04 DIAGNOSIS — Z87891 Personal history of nicotine dependence: Secondary | ICD-10-CM | POA: Insufficient documentation

## 2016-04-04 HISTORY — DX: Enterococcus as the cause of diseases classified elsewhere: B95.2

## 2016-04-04 HISTORY — DX: Urinary tract infection, site not specified: N39.0

## 2016-04-04 HISTORY — DX: Enterococcus as the cause of diseases classified elsewhere: N39.0

## 2016-04-04 LAB — CBC WITH DIFFERENTIAL/PLATELET
Basophils Absolute: 0 10*3/uL (ref 0.0–0.1)
Basophils Relative: 1 %
EOS ABS: 0 10*3/uL (ref 0.0–0.7)
Eosinophils Relative: 1 %
HEMATOCRIT: 30 % — AB (ref 39.0–52.0)
HEMOGLOBIN: 10 g/dL — AB (ref 13.0–17.0)
LYMPHS ABS: 1.4 10*3/uL (ref 0.7–4.0)
Lymphocytes Relative: 35 %
MCH: 29.2 pg (ref 26.0–34.0)
MCHC: 33.3 g/dL (ref 30.0–36.0)
MCV: 87.7 fL (ref 78.0–100.0)
MONOS PCT: 10 %
Monocytes Absolute: 0.4 10*3/uL (ref 0.1–1.0)
NEUTROS PCT: 55 %
Neutro Abs: 2.2 10*3/uL (ref 1.7–7.7)
Platelets: 224 10*3/uL (ref 150–400)
RBC: 3.42 MIL/uL — ABNORMAL LOW (ref 4.22–5.81)
RDW: 14.1 % (ref 11.5–15.5)
WBC: 4 10*3/uL (ref 4.0–10.5)

## 2016-04-04 LAB — I-STAT CHEM 8, ED
BUN: 16 mg/dL (ref 6–20)
CALCIUM ION: 1.24 mmol/L (ref 1.15–1.40)
CREATININE: 1.4 mg/dL — AB (ref 0.61–1.24)
Chloride: 109 mmol/L (ref 101–111)
GLUCOSE: 89 mg/dL (ref 65–99)
HCT: 32 % — ABNORMAL LOW (ref 39.0–52.0)
Hemoglobin: 10.9 g/dL — ABNORMAL LOW (ref 13.0–17.0)
Potassium: 4 mmol/L (ref 3.5–5.1)
Sodium: 145 mmol/L (ref 135–145)
TCO2: 23 mmol/L (ref 0–100)

## 2016-04-04 LAB — URINALYSIS, ROUTINE W REFLEX MICROSCOPIC
BILIRUBIN URINE: NEGATIVE
GLUCOSE, UA: NEGATIVE mg/dL
Hgb urine dipstick: NEGATIVE
KETONES UR: NEGATIVE mg/dL
Leukocytes, UA: NEGATIVE
Nitrite: NEGATIVE
PH: 5.5 (ref 5.0–8.0)
PROTEIN: NEGATIVE mg/dL
Specific Gravity, Urine: 1.024 (ref 1.005–1.030)

## 2016-04-04 NOTE — ED Notes (Signed)
Bed: [id number] Expected date:  Expected time:  Means of arrival:  Comments: EMS 75 yo male weakness and aggressive

## 2016-04-04 NOTE — ED Triage Notes (Signed)
[facility] via [facility].  Staff reports inappropriate behavior 'pulling his pants down' and agitation, as well as gen weakness for last few days.  Pt calm and cooperative with EMS and ED staff, does not know why he is here or what day/year it is.  At baseline oriented status w/hx dementia.  No one-sided weakness or facial droop noted. CBG 116, 148/98, 90 bpm, 16 RR

## 2016-04-04 NOTE — ED Notes (Signed)
Called [facility] to take [facility] back to [facility].  [facility] is on their way.

## 2016-04-04 NOTE — ED Provider Notes (Signed)
[facility]-EMERGENCY DEPT Provider Note   CSN: [id number] Arrival date & time: 04/04/16  2003   By signing my name below, I, Antonio Duran, attest that this documentation has been prepared under the direction and in the presence of  Antonio FavorGail Korver Graybeal, NP. Electronically Signed: Valentino SaxonBianca Duran, ED Scribe. 04/04/16. 8:36 PM.  History   Chief Complaint Chief Complaint  Patient presents with  . Weakness   The history is provided by the patient. No language interpreter was used.   HPI Comments: Antonio Duran is a 75 y.o. male brought in by EMS, who presents to the Emergency Department complaining of moderate, constant, generalized weakness onset today. Pt reports no associated symptoms at this time. Per Pt, he was given pain medication for his back pain a couple of months ago. He notes medication has had significant relief with his back pain. Pt states he lives at home with his wife. Per pt note, Pt lives in facility. He is slightly confused. Pt is aware and can state if something is wrong. Pt denies appetite change, nausea, diarrhea, weakness, dizziness, blurred vision. No additional complaints at this time.  Per EMS report patient was undressing in main room and having trouble buttoning his shirt   Past Medical History:  Diagnosis Date  . Alzheimer's dementia   . Enterococcus UTI     Patient Active Problem List   Diagnosis Date Noted  . Mixed dementia   . Palliative care by specialist   . Agitation   . Insomnia   . Enterococcus UTI 01/18/2016  . Confusion 01/18/2016  . Dementia with behavioral disturbance 01/18/2016    History reviewed. No pertinent surgical history.     Home Medications    Prior to Admission medications   Medication Sig Start Date End Date Taking? Authorizing Provider  acetaminophen (TYLENOL) 500 MG tablet Take 500 mg by mouth every 4 (four) hours as needed for mild pain.   Yes Historical Provider, MD  alum & mag hydroxide-simeth (MAALOX/MYLANTA) 200-200-20  MG/5ML suspension Take 30 mLs by mouth every 6 (six) hours as needed for indigestion or heartburn.   Yes Historical Provider, MD  citalopram (CELEXA) 10 MG tablet Take 1 tablet (10 mg total) by mouth daily. 01/23/16  Yes [name], MD  divalproex (DEPAKOTE SPRINKLE) 125 MG capsule Take 250 mg by mouth 3 (three) times daily.    Yes Historical Provider, MD  donepezil (ARICEPT) 10 MG tablet Take 10 mg by mouth daily.   Yes Historical Provider, MD  guaifenesin (ROBITUSSIN) 100 MG/5ML syrup Take 200 mg by mouth every 6 (six) hours as needed for cough.   Yes Historical Provider, MD  loperamide (IMODIUM) 2 MG capsule Take 2 mg by mouth as needed for diarrhea or loose stools.   Yes Historical Provider, MD  LORazepam (ATIVAN) 0.5 MG tablet Take 0.5 mg by mouth every 8 (eight) hours.   Yes Historical Provider, MD  LORazepam (ATIVAN) 0.5 MG tablet Take 0.5 mg by mouth 2 (two) times daily as needed for anxiety.   Yes Historical Provider, MD  magnesium hydroxide (MILK OF MAGNESIA) 400 MG/5ML suspension Take 30 mLs by mouth daily as needed for mild constipation.   Yes Historical Provider, MD  memantine (NAMENDA XR) 28 MG CP24 24 hr capsule Take 28 mg by mouth.   Yes Historical Provider, MD  neomycin-bacitracin-polymyxin (NEOSPORIN) ointment Apply 1 application topically as needed for wound care. apply to eye   Yes Historical Provider, MD  ondansetron (ZOFRAN-ODT) 8 MG disintegrating tablet Take 8  mg by mouth every 8 (eight) hours as needed for nausea or vomiting.   Yes Historical Provider, MD  QUEtiapine (SEROQUEL) 50 MG tablet Take 1 tablet (50 mg total) by mouth 2 (two) times daily. 01/23/16  Yes [name], MD  traZODone (DESYREL) 50 MG tablet Take 1 tablet (50 mg total) by mouth at bedtime. Patient taking differently: Take 250 mg by mouth at bedtime.  01/23/16  Yes [name], MD    Family History Family History  Problem Relation Age of Onset  . Dementia Sister     Social History Social  History  Substance Use Topics  . Smoking status: Former Games developer  . Smokeless tobacco: Never Used  . Alcohol use No     Allergies   Zolpidem   Review of Systems Review of Systems  Constitutional: Negative for appetite change and fever.  Respiratory: Negative for cough.   Cardiovascular: Negative for chest pain.  Gastrointestinal: Negative for diarrhea.  Genitourinary: Negative for dysuria and frequency.  Musculoskeletal: Positive for back pain.  Neurological: Positive for weakness. Negative for dizziness and headaches.  All other systems reviewed and are negative.    Physical Exam Updated Vital Signs BP 157/99 (BP Location: Left Arm)   Pulse 72   Temp 98.2 F (36.8 C) (Oral)   Resp 20   SpO2 97%   Physical Exam  Constitutional: He appears well-developed and well-nourished. No distress.  HENT:  Head: Normocephalic.  Nose: Nose normal.  Mouth/Throat: Oropharynx is clear and moist.  Eyes: Pupils are equal, round, and reactive to light.  Neck: Normal range of motion.  Cardiovascular: Normal rate.   Pulmonary/Chest: Effort normal.  Abdominal: Soft. Bowel sounds are normal.  Neurological: He is alert. He is disoriented. No cranial nerve deficit.  Nursing note and vitals reviewed.    ED Treatments / Results   DIAGNOSTIC STUDIES: Oxygen Saturation is 97% on RA, normal by my interpretation.    COORDINATION OF CARE: 8:25 PM Discussed treatment plan with pt at bedside which includes labs and pt agreed to plan.   Labs (all labs ordered are listed, but only abnormal results are displayed) Labs Reviewed  CBC WITH DIFFERENTIAL/PLATELET - Abnormal; Notable for the following:       Result Value   RBC 3.42 (*)    Hemoglobin 10.0 (*)    HCT 30.0 (*)    All other components within normal limits  I-STAT CHEM 8, ED - Abnormal; Notable for the following:    Creatinine, Ser 1.40 (*)    Hemoglobin 10.9 (*)    HCT 32.0 (*)    All other components within normal limits    URINALYSIS, ROUTINE W REFLEX MICROSCOPIC (NOT AT [facility])    EKG  EKG Interpretation None       Radiology No results found.  Procedures Procedures (including critical care time)  Medications Ordered in ED Medications - No data to display   Initial Impression / Assessment and Plan / ED Course  I have reviewed the triage vital signs and the nursing notes.  Pertinent labs & imaging results that were available during my care of the patient were reviewed by me and considered in my medical decision making (see chart for details).  Clinical Course      Labs and urine within normal parameters  Patient has been ambulated in the hall with no difficulty   Final Clinical Impressions(s) / ED Diagnoses   Final diagnoses:  Generalized weakness    New Prescriptions Discharge Medication  List as of 04/04/2016 10:18 PM      I personally performed the services described in this documentation, which was scribed in my presence. The recorded information has been reviewed and is accurate.    Antonio FavorGail Sianni Cloninger, NP 04/05/16 2019    Tilden FossaElizabeth Rees, MD 04/06/16 [phone]

## 2016-04-04 NOTE — ED Notes (Signed)
[facility] phone [facility] and told them [facility] would be bringing pt by soon.

## 2016-04-04 NOTE — ED Notes (Signed)
Pt ambulated in room with steady gait  

## 2016-04-04 NOTE — Discharge Instructions (Signed)
Tonight you were evaluated for weakness  Your lab values and urine show no sign of infection your vital signs are normal and you are able to ambulate independently  Please make an appointment with your PCP for follow up if you continue to have symptoms

## 2016-06-03 ENCOUNTER — Encounter (HOSPITAL_COMMUNITY): Payer: Self-pay

## 2016-06-03 ENCOUNTER — Emergency Department (HOSPITAL_COMMUNITY)
Admission: EM | Admit: 2016-06-03 | Discharge: 2016-06-03 | Disposition: A | Discharge status: Home / Self Care | Payer: Medicare HMO | Attending: Emergency Medicine | Admitting: Emergency Medicine

## 2016-06-03 DIAGNOSIS — S59902A Unspecified injury of left elbow, initial encounter: Secondary | ICD-10-CM | POA: Diagnosis present

## 2016-06-03 DIAGNOSIS — Y999 Unspecified external cause status: Secondary | ICD-10-CM | POA: Diagnosis not present

## 2016-06-03 DIAGNOSIS — Z23 Encounter for immunization: Secondary | ICD-10-CM | POA: Diagnosis not present

## 2016-06-03 DIAGNOSIS — Y929 Unspecified place or not applicable: Secondary | ICD-10-CM | POA: Insufficient documentation

## 2016-06-03 DIAGNOSIS — W010XXA Fall on same level from slipping, tripping and stumbling without subsequent striking against object, initial encounter: Secondary | ICD-10-CM | POA: Insufficient documentation

## 2016-06-03 DIAGNOSIS — Z79899 Other long term (current) drug therapy: Secondary | ICD-10-CM | POA: Insufficient documentation

## 2016-06-03 DIAGNOSIS — S50312A Abrasion of left elbow, initial encounter: Secondary | ICD-10-CM | POA: Diagnosis not present

## 2016-06-03 DIAGNOSIS — Y939 Activity, unspecified: Secondary | ICD-10-CM | POA: Diagnosis not present

## 2016-06-03 DIAGNOSIS — Z87891 Personal history of nicotine dependence: Secondary | ICD-10-CM | POA: Insufficient documentation

## 2016-06-03 DIAGNOSIS — W19XXXA Unspecified fall, initial encounter: Secondary | ICD-10-CM

## 2016-06-03 DIAGNOSIS — N289 Disorder of kidney and ureter, unspecified: Secondary | ICD-10-CM | POA: Diagnosis not present

## 2016-06-03 DIAGNOSIS — T148XXA Other injury of unspecified body region, initial encounter: Secondary | ICD-10-CM

## 2016-06-03 LAB — URINALYSIS, ROUTINE W REFLEX MICROSCOPIC
Bacteria, UA: NONE SEEN
Bilirubin Urine: NEGATIVE
Glucose, UA: NEGATIVE mg/dL
Ketones, ur: NEGATIVE mg/dL
Leukocytes, UA: NEGATIVE
Nitrite: NEGATIVE
Protein, ur: NEGATIVE mg/dL
Specific Gravity, Urine: 1.02 (ref 1.005–1.030)
pH: 6 (ref 5.0–8.0)

## 2016-06-03 LAB — I-STAT CHEM 8, ED
BUN: 30 mg/dL — ABNORMAL HIGH (ref 6–20)
Calcium, Ion: 1.23 mmol/L (ref 1.15–1.40)
Chloride: 107 mmol/L (ref 101–111)
Creatinine, Ser: 1.4 mg/dL — ABNORMAL HIGH (ref 0.61–1.24)
Glucose, Bld: 91 mg/dL (ref 65–99)
HCT: 33 % — ABNORMAL LOW (ref 39.0–52.0)
Hemoglobin: 11.2 g/dL — ABNORMAL LOW (ref 13.0–17.0)
Potassium: 4.4 mmol/L (ref 3.5–5.1)
Sodium: 141 mmol/L (ref 135–145)
TCO2: 28 mmol/L (ref 0–100)

## 2016-06-03 LAB — VALPROIC ACID LEVEL: VALPROIC ACID LVL: 21 ug/mL — AB (ref 50.0–100.0)

## 2016-06-03 MED ORDER — TETANUS-DIPHTH-ACELL PERTUSSIS 5-2.5-18.5 LF-MCG/0.5 IM SUSP
0.5000 mL | Freq: Once | INTRAMUSCULAR | Status: AC
  Administered 2016-06-03: 0.5 mL via INTRAMUSCULAR
  Filled 2016-06-03: qty 0.5

## 2016-06-03 NOTE — ED Provider Notes (Signed)
Follow-up evaluation after return of urinalysis, and valproate level. Pacer wires were done as screening for causes of falling. Urinalysis was done by catheterization.  Results for orders placed or performed during the hospital encounter of 06/03/16  Urinalysis, Routine w reflex microscopic  Result Value Ref Range   Color, Urine YELLOW YELLOW   APPearance CLEAR CLEAR   Specific Gravity, Urine 1.020 1.005 - 1.030   pH 6.0 5.0 - 8.0   Glucose, UA NEGATIVE NEGATIVE mg/dL   Hgb urine dipstick LARGE (A) NEGATIVE   Bilirubin Urine NEGATIVE NEGATIVE   Ketones, ur NEGATIVE NEGATIVE mg/dL   Protein, ur NEGATIVE NEGATIVE mg/dL   Nitrite NEGATIVE NEGATIVE   Leukocytes, UA NEGATIVE NEGATIVE   RBC / HPF TOO NUMEROUS TO COUNT 0 - 5 RBC/hpf   WBC, UA 0-5 0 - 5 WBC/hpf   Bacteria, UA NONE SEEN NONE SEEN   Squamous Epithelial / LPF 0-5 (A) NONE SEEN   Mucous PRESENT   Valproic acid level  Result Value Ref Range   Valproic Acid Lvl 21 (L) 50.0 - 100.0 ug/mL  I-stat chem 8, ed  Result Value Ref Range   Sodium 141 135 - 145 mmol/L   Potassium 4.4 3.5 - 5.1 mmol/L   Chloride 107 101 - 111 mmol/L   BUN 30 (H) 6 - 20 mg/dL   Creatinine, Ser 4.091.40 (H) 0.61 - 1.24 mg/dL   Glucose, Bld 91 65 - 99 mg/dL   Calcium, Ion 8.111.23 9.141.15 - 1.40 mmol/L   TCO2 28 0 - 100 mmol/L   Hemoglobin 11.2 (L) 13.0 - 17.0 g/dL   HCT 78.233.0 (L) 95.639.0 - 21.352.0 %    Medical decision making- evaluation for causes of falling, are negative both clinically and by lab. There is no evidence of significant anemia, significantly worse renal insufficiency, or abnormally  High valproate level. There has been no reported seizure activity.  Nursing Notes Reviewed/ Care Coordinated Applicable Imaging Reviewed Interpretation of Laboratory Data incorporated into ED treatment  The patient appears reasonably screened and/or stabilized for discharge and I doubt any other medical condition or other [facility] requiring further screening, evaluation, or  treatment in the ED at this time prior to discharge.  Plan: Home Medications- continue; Home Treatments- rest, fluids; return here if the recommended treatment, does not improve the symptoms; Recommended follow up- PCP 1 week for check up    Mancel BaleElliott Sharlena Kristensen, MD 06/03/16 1840

## 2016-06-03 NOTE — ED Notes (Signed)
[facility] called for transport.  

## 2016-06-03 NOTE — ED Provider Notes (Signed)
[facility]-EMERGENCY DEPT Provider Note   CSN: [id number] Arrival date & time: 06/03/16  1426     History   Chief Complaint Chief Complaint  Patient presents with  . Fall  . Skin Tear    HPI Antonio Duran is a 76 y.o. male.Level V caveat dementia history is obtained from Ms.Reason, LPN at assisted-living facility. Patient tripped over another resident and fell today, injuring his left elbow. Assisted-living facility were also reports that his behavior is been somewhat "off" over the past one or 2 weeks and requested to check for urinary tract infection. Check 2 falls in the past week. No fever no vomiting. Patient denies pain anywhere.  HPI  Past Medical History:  Diagnosis Date  . Alzheimer's dementia   . Enterococcus UTI     Patient Active Problem List   Diagnosis Date Noted  . Mixed dementia   . Palliative care by specialist   . Agitation   . Insomnia   . Enterococcus UTI 01/18/2016  . Confusion 01/18/2016  . Dementia with behavioral disturbance 01/18/2016    History reviewed. No pertinent surgical history.     Home Medications    Prior to Admission medications   Medication Sig Start Date End Date Taking? Authorizing Provider  acetaminophen (TYLENOL) 500 MG tablet Take 500 mg by mouth every 4 (four) hours as needed for mild pain.   Yes Historical Provider, MD  alum & mag hydroxide-simeth (MAALOX/MYLANTA) 200-200-20 MG/5ML suspension Take 30 mLs by mouth every 6 (six) hours as needed for indigestion or heartburn.   Yes Historical Provider, MD  citalopram (CELEXA) 10 MG tablet Take 1 tablet (10 mg total) by mouth daily. 01/23/16  Yes [name], MD  divalproex (DEPAKOTE SPRINKLE) 125 MG capsule Take 250 mg by mouth 3 (three) times daily.    Yes Historical Provider, MD  donepezil (ARICEPT) 10 MG tablet Take 10 mg by mouth daily.   Yes Historical Provider, MD  guaifenesin (ROBITUSSIN) 100 MG/5ML syrup Take 200 mg by mouth every 6 (six) hours as needed for cough.    Yes Historical Provider, MD  loperamide (IMODIUM) 2 MG capsule Take 2 mg by mouth as needed for diarrhea or loose stools.   Yes Historical Provider, MD  LORazepam (ATIVAN) 0.5 MG tablet Take 0.5 mg by mouth every 8 (eight) hours.   Yes Historical Provider, MD  LORazepam (ATIVAN) 0.5 MG tablet Take 0.5 mg by mouth 2 (two) times daily as needed for anxiety.   Yes Historical Provider, MD  magnesium hydroxide (MILK OF MAGNESIA) 400 MG/5ML suspension Take 30 mLs by mouth daily as needed for mild constipation.   Yes Historical Provider, MD  memantine (NAMENDA XR) 28 MG CP24 24 hr capsule Take 28 mg by mouth.   Yes Historical Provider, MD  neomycin-bacitracin-polymyxin (NEOSPORIN) ointment Apply 1 application topically as needed for wound care. apply to eye   Yes Historical Provider, MD  QUEtiapine (SEROQUEL) 50 MG tablet Take 1 tablet (50 mg total) by mouth 2 (two) times daily. 01/23/16  Yes [name], MD  traZODone (DESYREL) 50 MG tablet Take 1 tablet (50 mg total) by mouth at bedtime. Patient taking differently: Take 150 mg by mouth at bedtime.  01/23/16  Yes [name], MD    Family History Family History  Problem Relation Age of Onset  . Dementia Sister     Social History Social History  Substance Use Topics  . Smoking status: Former Games developermoker  . Smokeless tobacco: Never Used  .  Alcohol use No     Allergies   Zolpidem   Review of Systems Review of Systems  Unable to perform ROS: Dementia  Skin: Positive for wound.       Abrasion to left elbow     Physical Exam Updated Vital Signs BP 143/98   Pulse 62   Temp 98.8 F (37.1 C) (Oral)   Resp 15   SpO2 97%   Physical Exam  Constitutional: He appears well-developed and well-nourished.  HENT:  Head: Normocephalic and atraumatic.  Eyes: Conjunctivae are normal. Pupils are equal, round, and reactive to light.  Neck: Neck supple. No tracheal deviation present. No thyromegaly present.  Cardiovascular: Normal rate and  regular rhythm.   No murmur heard. Pulmonary/Chest: Effort normal and breath sounds normal.  Abdominal: Soft. Bowel sounds are normal. He exhibits no distension. There is no tenderness.  Musculoskeletal: Normal range of motion. He exhibits no edema or tenderness.  Left upper extremity with dime-sized abrasion at lateral aspect of elbow. No soft tissue swelling no bony tenderness. Radial pulse 2+. Entire spine nontender. Pelvis stable nontender. All other extremities no contusion abrasion or tenderness neurovascularly intact  Neurological: He is alert.  Result extremity is possible commands  Skin: Skin is warm and dry. No rash noted.  Psychiatric: He has a normal mood and affect.  Nursing note and vitals reviewed.    ED Treatments / Results  Labs (all labs ordered are listed, but only abnormal results are displayed) Labs Reviewed  URINALYSIS, ROUTINE W REFLEX MICROSCOPIC  VALPROIC ACID LEVEL  I-STAT CHEM 8, ED    EKG  EKG Interpretation None       Radiology No results found.  Procedures Procedures (including critical care time)  Medications Ordered in ED Medications  Tdap (BOOSTRIX) injection 0.5 mL (not administered)    Results for orders placed or performed during the hospital encounter of 06/03/16  I-stat chem 8, ed  Result Value Ref Range   Sodium 141 135 - 145 mmol/L   Potassium 4.4 3.5 - 5.1 mmol/L   Chloride 107 101 - 111 mmol/L   BUN 30 (H) 6 - 20 mg/dL   Creatinine, Ser 1.61 (H) 0.61 - 1.24 mg/dL   Glucose, Bld 91 65 - 99 mg/dL   Calcium, Ion 0.96 0.45 - 1.40 mmol/L   TCO2 28 0 - 100 mmol/L   Hemoglobin 11.2 (L) 13.0 - 17.0 g/dL   HCT 40.9 (L) 81.1 - 91.4 %   No results found. Initial Impression / Assessment and Plan / ED Course  I have reviewed the triage vital signs and the nursing notes.  Pertinent labs & imaging results that were available during my care of the patient were reviewed by me and considered in my medical decision making (see chart  for details).   tdap updatedc  Pt signed out to Dr Effie Shy 5pm Renal insufficiency is chronic Final Clinical Impressions(s) / ED Diagnoses  Dx #1 fall #2 abrasion of left elbow #3chronic renal insufficiency Final diagnoses:  None    New Prescriptions New Prescriptions   No medications on file     Doug Sou, MD 06/03/16 1705

## 2016-06-03 NOTE — ED Notes (Signed)
[facility] Expected date:  Expected time:  Means of arrival:  Comments: EMS- 76yo M, trip and fall/skin tear to L elbow

## 2016-06-03 NOTE — ED Notes (Signed)
[facility] Expected date:  Expected time:  Means of arrival:  Comments: Pt still in room

## 2016-06-03 NOTE — ED Triage Notes (Addendum)
Per EMS[facility] after a trip and fall.  Pt has no complaints.  Skin tear noted on L elbow.  Denies hitting head and no blood thinners.  Staff witnessed Pt tripping over another resident.  Facility reported to EMS that the Pt has had several recent falls and would like him evaluated for a uti.

## 2016-06-03 NOTE — Discharge Instructions (Signed)
Evaluation today in the emergency department did not show any serious injuries.  A screening urinalysis did not show infection.  Valproate level was somewhat low, at 21. Please make sure he is taking all of his valproate medication as directed. He should follow-up with his primary care provider next week for a repeat valproate level.

## 2016-08-06 ENCOUNTER — Emergency Department (HOSPITAL_COMMUNITY): Payer: Medicare HMO

## 2016-08-06 ENCOUNTER — Encounter (HOSPITAL_COMMUNITY): Payer: Self-pay | Admitting: Emergency Medicine

## 2016-08-06 ENCOUNTER — Emergency Department (HOSPITAL_COMMUNITY)
Admission: EM | Admit: 2016-08-06 | Discharge: 2016-08-06 | Disposition: A | Discharge status: Home / Self Care | Payer: Medicare HMO | Attending: Emergency Medicine | Admitting: Emergency Medicine

## 2016-08-06 DIAGNOSIS — S51011A Laceration without foreign body of right elbow, initial encounter: Secondary | ICD-10-CM | POA: Diagnosis not present

## 2016-08-06 DIAGNOSIS — Y999 Unspecified external cause status: Secondary | ICD-10-CM | POA: Diagnosis not present

## 2016-08-06 DIAGNOSIS — W1839XA Other fall on same level, initial encounter: Secondary | ICD-10-CM | POA: Diagnosis not present

## 2016-08-06 DIAGNOSIS — Z79899 Other long term (current) drug therapy: Secondary | ICD-10-CM | POA: Insufficient documentation

## 2016-08-06 DIAGNOSIS — S40011A Contusion of right shoulder, initial encounter: Secondary | ICD-10-CM | POA: Diagnosis not present

## 2016-08-06 DIAGNOSIS — G309 Alzheimer's disease, unspecified: Secondary | ICD-10-CM | POA: Insufficient documentation

## 2016-08-06 DIAGNOSIS — S59901A Unspecified injury of right elbow, initial encounter: Secondary | ICD-10-CM | POA: Diagnosis present

## 2016-08-06 DIAGNOSIS — Z87891 Personal history of nicotine dependence: Secondary | ICD-10-CM | POA: Diagnosis not present

## 2016-08-06 DIAGNOSIS — Y9289 Other specified places as the place of occurrence of the external cause: Secondary | ICD-10-CM | POA: Insufficient documentation

## 2016-08-06 DIAGNOSIS — W19XXXA Unspecified fall, initial encounter: Secondary | ICD-10-CM

## 2016-08-06 DIAGNOSIS — Y9301 Activity, walking, marching and hiking: Secondary | ICD-10-CM | POA: Insufficient documentation

## 2016-08-06 NOTE — ED Notes (Signed)
[facility] Expected date:  Expected time:  Means of arrival:  Comments: 76 yo m fall, no pain

## 2016-08-06 NOTE — ED Provider Notes (Signed)
[facility] Provider Note   CSN: [id number] Arrival date & time: 08/06/16  1411     History   Chief Complaint Chief Complaint  Patient presents with  . Fall    HPI Jaquin Coy is a 76 y.o. male.  Pt presents to the ED today with fall.  The pt has dementia, so hx is obtained from EMS.  The pt's fall was witnessed.  He was walking without his usual cane in socks when he slipped.  He hit his right shoulder and elbow on the wall.  He did not hit his head.  No loc.      Past Medical History:  Diagnosis Date  . Alzheimer's dementia   . Enterococcus UTI     Patient Active Problem List   Diagnosis Date Noted  . Mixed dementia   . Palliative care by specialist   . Agitation   . Insomnia   . Enterococcus UTI 01/18/2016  . Confusion 01/18/2016  . Dementia with behavioral disturbance 01/18/2016    History reviewed. No pertinent surgical history.     Home Medications    Prior to Admission medications   Medication Sig Start Date End Date Taking? Authorizing Provider  acetaminophen (TYLENOL) 500 MG tablet Take 500 mg by mouth every 4 (four) hours as needed for mild pain.    Historical Provider, MD  alum & mag hydroxide-simeth (MAALOX/MYLANTA) 200-200-20 MG/5ML suspension Take 30 mLs by mouth every 6 (six) hours as needed for indigestion or heartburn.    Historical Provider, MD  citalopram (CELEXA) 10 MG tablet Take 1 tablet (10 mg total) by mouth daily. Patient not taking: Reported on 06/03/2016 01/23/16   [name], MD  divalproex (DEPAKOTE SPRINKLE) 125 MG capsule Take 250 mg by mouth 3 (three) times daily.     Historical Provider, MD  donepezil (ARICEPT) 10 MG tablet Take 10 mg by mouth daily.    Historical Provider, MD  guaifenesin (ROBITUSSIN) 100 MG/5ML syrup Take 200 mg by mouth every 6 (six) hours as needed for cough.    Historical Provider, MD  loperamide (IMODIUM) 2 MG capsule Take 2 mg by mouth as needed for diarrhea or loose stools.    Historical  Provider, MD  LORazepam (ATIVAN) 0.5 MG tablet Take 0.5 mg by mouth every 8 (eight) hours.    Historical Provider, MD  magnesium hydroxide (MILK OF MAGNESIA) 400 MG/5ML suspension Take 30 mLs by mouth daily as needed for mild constipation.    Historical Provider, MD  memantine (NAMENDA XR) 28 MG CP24 24 hr capsule Take 28 mg by mouth.    Historical Provider, MD  neomycin-bacitracin-polymyxin (NEOSPORIN) ointment Apply 1 application topically as needed for wound care. apply to eye    Historical Provider, MD  PARoxetine (PAXIL) 20 MG tablet Take 20 mg by mouth daily. 04/05/16   Historical Provider, MD  QUEtiapine (SEROQUEL) 50 MG tablet Take 1 tablet (50 mg total) by mouth 2 (two) times daily. 01/23/16   [name], MD  traZODone (DESYREL) 50 MG tablet Take 1 tablet (50 mg total) by mouth at bedtime. Patient taking differently: Take 150 mg by mouth at bedtime.  01/23/16   [name], MD    Family History Family History  Problem Relation Age of Onset  . Dementia Sister     Social History Social History  Substance Use Topics  . Smoking status: Former Games developer  . Smokeless tobacco: Never Used  . Alcohol use No     Allergies  Zolpidem   Review of Systems Review of Systems  Musculoskeletal:       Right shoulder/elbow injury  All other systems reviewed and are negative.    Physical Exam Updated Vital Signs BP (!) 125/97   Pulse 70   Temp 98.3 F (36.8 C) (Oral)   Resp 12   SpO2 100%   Physical Exam  Constitutional: He appears well-developed and well-nourished.  HENT:  Head: Normocephalic and atraumatic.  Right Ear: External ear normal.  Left Ear: External ear normal.  Nose: Nose normal.  Mouth/Throat: Oropharynx is clear and moist.  Eyes: Conjunctivae and EOM are normal. Pupils are equal, round, and reactive to light.  Neck: Normal range of motion. Neck supple.  Cardiovascular: Normal rate, regular rhythm, normal heart sounds and intact distal pulses.     Pulmonary/Chest: Effort normal and breath sounds normal.  Abdominal: Soft. Bowel sounds are normal.  Musculoskeletal: Normal range of motion.  Neurological: He is alert.  Oriented to name.  He is following all commands and is moving all extremities.  Skin: Skin is warm.  Skin tear right elbow.  Good rom in right shoulder and elbow  Psychiatric: He has a normal mood and affect. His behavior is normal. Judgment and thought content normal.  Nursing note and vitals reviewed.    ED Treatments / Results  Labs (all labs ordered are listed, but only abnormal results are displayed) Labs Reviewed - No data to display  EKG  EKG Interpretation None       Radiology Dg Shoulder Right  Result Date: 08/06/2016 CLINICAL DATA:  Larey Seat today, walking without pain. Fell bowel with mild wall brain. RIGHT elbow injury. EXAM: RIGHT SHOULDER - 2+ VIEW; RIGHT ELBOW - COMPLETE 3+ VIEW COMPARISON:  None. FINDINGS: RIGHT elbow: No acute fracture deformity or dislocation. Joint spaces intact without erosions. Corticated bony fragments. No destructive bony lesions. Soft tissue planes are not suspicious. RIGHT shoulder: The humeral head is well-formed and located. No advanced degenerative change for age. The subacromial, glenohumeral and acromioclavicular joint spaces are intact. No destructive bony lesions. Soft tissue planes are non-suspicious. Granulomas project RIGHT lung. IMPRESSION: RIGHT elbow: No acute fracture deformity or dislocation. RIGHT shoulder: No acute fracture deformity or dislocation. Electronically Signed   By: Awilda Metro M.D.   On: 08/06/2016 15:23   Dg Elbow Complete Right  Result Date: 08/06/2016 CLINICAL DATA:  Larey Seat today, walking without pain. Fell bowel with mild wall brain. RIGHT elbow injury. EXAM: RIGHT SHOULDER - 2+ VIEW; RIGHT ELBOW - COMPLETE 3+ VIEW COMPARISON:  None. FINDINGS: RIGHT elbow: No acute fracture deformity or dislocation. Joint spaces intact without erosions.  Corticated bony fragments. No destructive bony lesions. Soft tissue planes are not suspicious. RIGHT shoulder: The humeral head is well-formed and located. No advanced degenerative change for age. The subacromial, glenohumeral and acromioclavicular joint spaces are intact. No destructive bony lesions. Soft tissue planes are non-suspicious. Granulomas project RIGHT lung. IMPRESSION: RIGHT elbow: No acute fracture deformity or dislocation. RIGHT shoulder: No acute fracture deformity or dislocation. Electronically Signed   By: Awilda Metro M.D.   On: 08/06/2016 15:23    Procedures Procedures (including critical care time)  Medications Ordered in ED Medications - No data to display   Initial Impression / Assessment and Plan / ED Course  I have reviewed the triage vital signs and the nursing notes.  Pertinent labs & imaging results that were available during my care of the patient were reviewed by me and considered in my  medical decision making (see chart for details).    Pt has no pain.  He is stable for d/c.  Return if worse.   Final Clinical Impressions(s) / ED Diagnoses   Final diagnoses:  Fall, initial encounter  Skin tear of right elbow without complication, initial encounter  Contusion of right shoulder, initial encounter    New Prescriptions New Prescriptions   No medications on file     Jacalyn LefevreJulie Promyse Ardito, MD 08/06/16 1542

## 2016-08-06 NOTE — ED Triage Notes (Signed)
[facility] today , per bystanders pt was walking without his cane, fell backward into to the wall frame , right elbow skin tear, right shoulder injury yet pt denies pain per EMS. No loc  No neck pain. Alert and oriented to self and to event normal to baseline. No anticoagulants intake

## 2016-09-11 ENCOUNTER — Emergency Department (HOSPITAL_COMMUNITY): Payer: Medicare HMO

## 2016-09-11 ENCOUNTER — Encounter (HOSPITAL_COMMUNITY): Payer: Self-pay

## 2016-09-11 ENCOUNTER — Emergency Department (HOSPITAL_COMMUNITY)
Admission: EM | Admit: 2016-09-11 | Discharge: 2016-09-11 | Disposition: A | Discharge status: Home / Self Care | Payer: Medicare HMO | Attending: Emergency Medicine | Admitting: Emergency Medicine

## 2016-09-11 DIAGNOSIS — Y999 Unspecified external cause status: Secondary | ICD-10-CM | POA: Diagnosis not present

## 2016-09-11 DIAGNOSIS — Y92128 Other place in nursing home as the place of occurrence of the external cause: Secondary | ICD-10-CM | POA: Diagnosis not present

## 2016-09-11 DIAGNOSIS — Y939 Activity, unspecified: Secondary | ICD-10-CM | POA: Insufficient documentation

## 2016-09-11 DIAGNOSIS — S0990XA Unspecified injury of head, initial encounter: Secondary | ICD-10-CM

## 2016-09-11 DIAGNOSIS — G309 Alzheimer's disease, unspecified: Secondary | ICD-10-CM | POA: Diagnosis not present

## 2016-09-11 DIAGNOSIS — Z87891 Personal history of nicotine dependence: Secondary | ICD-10-CM | POA: Diagnosis not present

## 2016-09-11 DIAGNOSIS — W01198A Fall on same level from slipping, tripping and stumbling with subsequent striking against other object, initial encounter: Secondary | ICD-10-CM | POA: Insufficient documentation

## 2016-09-11 DIAGNOSIS — W19XXXA Unspecified fall, initial encounter: Secondary | ICD-10-CM

## 2016-09-11 LAB — CBG MONITORING, ED: GLUCOSE-CAPILLARY: 88 mg/dL (ref 65–99)

## 2016-09-11 NOTE — ED Notes (Signed)
Attempted to call report to [facility].

## 2016-09-11 NOTE — ED Triage Notes (Signed)
PT SENT FROM [facility] VIA EMS FOR A WITNESSED FALL. PER EMS, STAFF STS HE TRIP AND FELL IN THE DINING ROOM AREA, HITTING HIS HEAD ANOTHER WHEELCHAIR. -LOC OR OBVIOUS INJURIES. PT HAS A HX OF DEMENTIA, AAOX1 AT BASELINE. UPON ARRIVAL, PT FOUND SLEEPING ON A PILLOW, AWOKE WITH NO COMPLAINTS. PLACED A TOWEL COLLAR ON PTA.

## 2016-09-11 NOTE — ED Notes (Signed)
[facility] called for transport.  

## 2016-09-11 NOTE — ED Provider Notes (Signed)
[facility] DEPT Provider Note   CSN: [id number] Arrival date & time: 09/11/16  1113     History   Chief Complaint Chief Complaint  Patient presents with  . Fall    HPI Antonio Duran is a 76 y.o. male.  Pt presents to the ED today s/p fall at the SNF.  The pt's fall was witnessed.  He tripped and fell in the dining room and hit the back of his head on a wheelchair.  He did not have a loc.  He is not on blood thinners.  He does not complain of any pain.  Pt has dementia, so is unable to give any hx.      Past Medical History:  Diagnosis Date  . Alzheimer's dementia   . Enterococcus UTI     Patient Active Problem List   Diagnosis Date Noted  . Mixed dementia   . Palliative care by specialist   . Agitation   . Insomnia   . Enterococcus UTI 01/18/2016  . Confusion 01/18/2016  . Dementia with behavioral disturbance 01/18/2016    History reviewed. No pertinent surgical history.     Home Medications    Prior to Admission medications   Medication Sig Start Date End Date Taking? Authorizing Provider  acetaminophen (TYLENOL) 500 MG tablet Take 500 mg by mouth every 4 (four) hours as needed for mild pain, moderate pain, fever or headache.    Yes Historical Provider, MD  alum & mag hydroxide-simeth (MAALOX/MYLANTA) 200-200-20 MG/5ML suspension Take 30 mLs by mouth every 6 (six) hours as needed for indigestion or heartburn.   Yes Historical Provider, MD  divalproex (DEPAKOTE SPRINKLE) 125 MG capsule Take 250 mg by mouth 3 (three) times daily.    Yes Historical Provider, MD  estradiol (ESTRACE) 0.5 MG tablet Take 0.5 mg by mouth daily.   Yes Historical Provider, MD  guaifenesin (ROBITUSSIN) 100 MG/5ML syrup Take 200 mg by mouth every 6 (six) hours as needed for cough.   Yes Historical Provider, MD  loperamide (IMODIUM) 2 MG capsule Take 2 mg by mouth every 3 (three) hours as needed for diarrhea or loose stools.    Yes Historical Provider, MD  LORazepam (ATIVAN) 0.5 MG  tablet Take 0.5 mg by mouth 2 (two) times daily.    Yes Historical Provider, MD  magnesium hydroxide (MILK OF MAGNESIA) 400 MG/5ML suspension Take 30 mLs by mouth at bedtime as needed for mild constipation.    Yes Historical Provider, MD  memantine (NAMENDA XR) 28 MG CP24 24 hr capsule Take 28 mg by mouth daily.    Yes Historical Provider, MD  neomycin-bacitracin-polymyxin (NEOSPORIN) ointment Apply 1 application topically every 3 (three) hours as needed (for skin tears/abrasions). apply to eye    Yes Historical Provider, MD  ondansetron (ZOFRAN-ODT) 8 MG disintegrating tablet Take 8 mg by mouth 3 (three) times daily as needed for nausea or vomiting.   Yes Historical Provider, MD  PARoxetine (PAXIL) 40 MG tablet Take 40 mg by mouth at bedtime.   Yes Historical Provider, MD  QUEtiapine (SEROQUEL) 50 MG tablet Take 1 tablet (50 mg total) by mouth 2 (two) times daily. 01/23/16  Yes [name], MD  traZODone (DESYREL) 150 MG tablet Take 150 mg by mouth at bedtime.   Yes Historical Provider, MD    Family History Family History  Problem Relation Age of Onset  . Dementia Sister     Social History Social History  Substance Use Topics  . Smoking status: Former Games developer  .  Smokeless tobacco: Never Used  . Alcohol use No     Allergies   Zolpidem   Review of Systems Review of Systems  All other systems reviewed and are negative.    Physical Exam Updated Vital Signs BP 126/86 (BP Location: Right Arm)   Pulse 70   Temp 98.4 F (36.9 C) (Oral)   Resp 16   Ht  (1.778 m)   Wt 140 lb (63.5 kg)   SpO2 97%   BMI 20.09 kg/m   Physical Exam  Constitutional: He appears well-developed and well-nourished.  HENT:  Head: Normocephalic.    Right Ear: External ear normal.  Left Ear: External ear normal.  Nose: Nose normal.  Mouth/Throat: Oropharynx is clear and moist.  Eyes: Conjunctivae and EOM are normal. Pupils are equal, round, and reactive to light.  Neck: Normal range of  motion. Neck supple.  Cardiovascular: Normal rate, regular rhythm, normal heart sounds and intact distal pulses.   Pulmonary/Chest: Effort normal and breath sounds normal.  Abdominal: Soft. Bowel sounds are normal.  Musculoskeletal: Normal range of motion.  Neurological: He is alert.  Oriented to person (normal per EMS)  Skin: Skin is warm.  Psychiatric: He has a normal mood and affect. His behavior is normal.  Nursing note and vitals reviewed.    ED Treatments / Results  Labs (all labs ordered are listed, but only abnormal results are displayed) Labs Reviewed  CBG MONITORING, ED    EKG  EKG Interpretation None       Radiology Ct Head Wo Contrast  Result Date: 09/11/2016 CLINICAL DATA:  WITNESSED FALL. PER EMS, STAFF STS HE TRIP AND FELL IN THE DINING ROOM AREA, HITTING HIS HEAD ANOTHER WHEELCHAIR. -LOC OR OBVIOUS INJURIES. PT HAS A HX OF DEMENTIA, EXAM: CT HEAD WITHOUT CONTRAST TECHNIQUE: Contiguous axial images were obtained from the base of the skull through the vertex without intravenous contrast. COMPARISON:  02/20/2016 FINDINGS: Brain: There is a small focal area of hypoattenuation that involves the cortex of the anterolateral right frontal lobe, contiguous with hypoattenuation in the underlying white matter. This is new since the prior exam, with an appearance consistent with a subacute infarct. There is no other evidence of a recent infarct. The ventricles are normal configuration. There is ventricular and sulcal enlargement reflecting moderate atrophy. Extensive white matter hypoattenuation is noted consistent with advanced chronic microvascular ischemic change. There are no parenchymal masses or mass effect. There are no extra-axial masses or abnormal fluid collections. There is no intracranial hemorrhage. Vascular: No hyperdense vessel or unexpected calcification. Skull: Normal. Negative for fracture or focal lesion. Sinuses/Orbits: Globes orbits are unremarkable. Visualized  sinuses and mastoid air cells are clear. Other: None. IMPRESSION: 1. 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 2. No other acute or recent abnormalities. 3. No intracranial hemorrhage. 4. Moderate atrophy and advanced chronic microvascular ischemic change. Electronically Signed   By: Amie Portland M.D.   On: 09/11/2016 12:17    Procedures Procedures (including critical care time)  Medications Ordered in ED Medications - No data to display   Initial Impression / Assessment and Plan / ED Course  I have reviewed the triage vital signs and the nursing notes.  Pertinent labs & imaging results that were available during my care of the patient were reviewed by me and considered in my medical decision making (see chart for details).  CT negative.  No other injury.  Pt is stable for d/c.  Final Clinical Impressions(s) / ED Diagnoses   Final diagnoses:  Fall, initial encounter  Injury of head, initial encounter    New Prescriptions New Prescriptions   No medications on file     Jacalyn Lefevre, MD 09/11/16 1236

## 2016-09-11 NOTE — ED Notes (Signed)
[facility] ARRIVED TO TAKE PT BACK TO [facility]. PT IN NO APPARENT DISTRESS OR PAIN.

## 2016-09-11 NOTE — ED Notes (Signed)
Bed: [facility] Expected date:  Expected time:  Means of arrival:  Comments: EMS_fall 

## 2016-10-12 ENCOUNTER — Emergency Department (HOSPITAL_COMMUNITY)
Admission: EM | Admit: 2016-10-12 | Discharge: 2016-10-12 | Disposition: A | Discharge status: Home / Self Care | Payer: Medicare HMO | Attending: Emergency Medicine | Admitting: Emergency Medicine

## 2016-10-12 ENCOUNTER — Encounter (HOSPITAL_COMMUNITY): Payer: Self-pay

## 2016-10-12 ENCOUNTER — Emergency Department (HOSPITAL_COMMUNITY): Payer: Medicare HMO

## 2016-10-12 DIAGNOSIS — S61412A Laceration without foreign body of left hand, initial encounter: Secondary | ICD-10-CM

## 2016-10-12 DIAGNOSIS — Z79899 Other long term (current) drug therapy: Secondary | ICD-10-CM | POA: Diagnosis not present

## 2016-10-12 DIAGNOSIS — Y939 Activity, unspecified: Secondary | ICD-10-CM | POA: Diagnosis not present

## 2016-10-12 DIAGNOSIS — W19XXXA Unspecified fall, initial encounter: Secondary | ICD-10-CM

## 2016-10-12 DIAGNOSIS — Y929 Unspecified place or not applicable: Secondary | ICD-10-CM | POA: Insufficient documentation

## 2016-10-12 DIAGNOSIS — W228XXA Striking against or struck by other objects, initial encounter: Secondary | ICD-10-CM | POA: Diagnosis not present

## 2016-10-12 DIAGNOSIS — Y999 Unspecified external cause status: Secondary | ICD-10-CM | POA: Diagnosis not present

## 2016-10-12 DIAGNOSIS — Z87891 Personal history of nicotine dependence: Secondary | ICD-10-CM | POA: Insufficient documentation

## 2016-10-12 DIAGNOSIS — G309 Alzheimer's disease, unspecified: Secondary | ICD-10-CM | POA: Insufficient documentation

## 2016-10-12 MED ORDER — BACITRACIN ZINC 500 UNIT/GM EX OINT
1.0000 "application " | TOPICAL_OINTMENT | Freq: Two times a day (BID) | CUTANEOUS | Status: DC
  Administered 2016-10-12: 1 via TOPICAL
  Filled 2016-10-12: qty 0.9

## 2016-10-12 NOTE — ED Notes (Signed)
Bed: [facility] Expected date:  Expected time:  Means of arrival:  Comments: No bed  

## 2016-10-12 NOTE — Discharge Instructions (Signed)
RETURN TO ER IF ANY CONCERNS FOR WOUND INFECTION INCLUDING DRAINAGE, REDNESS, OR SWELLING.

## 2016-10-12 NOTE — ED Provider Notes (Signed)
[facility]-EMERGENCY DEPT Provider Note   CSN: [id number] Arrival date & time: 10/12/16  1325     History   Chief Complaint Chief Complaint  Patient presents with  . Fall    HPI Antonio Duran is a 76 y.o. male.  76 year old male with past medical history including dementia who presents with fall. EMS reports that staff at his nursing facility witnessed a fall forward as he was exiting a wheelchair. He struck his for head and sustained a skin tear on his left hand. He is at his neurologic baseline of mild confusion because of his dementia. He denies any complaints for me.  LEVEL 5 CAVEAT DUE TO DEMENTIA   The history is provided by the EMS personnel and the nursing home.  Fall     Past Medical History:  Diagnosis Date  . Alzheimer's dementia   . Enterococcus UTI     Patient Active Problem List   Diagnosis Date Noted  . Mixed dementia   . Palliative care by specialist   . Agitation   . Insomnia   . Enterococcus UTI 01/18/2016  . Confusion 01/18/2016  . Dementia with behavioral disturbance 01/18/2016    No past surgical history on file.     Home Medications    Prior to Admission medications   Medication Sig Start Date End Date Taking? Authorizing Provider  acetaminophen (TYLENOL) 500 MG tablet Take 500 mg by mouth every 4 (four) hours as needed for mild pain, moderate pain, fever or headache.     [provider]  alum & mag hydroxide-simeth (MAALOX/MYLANTA) 200-200-20 MG/5ML suspension Take 30 mLs by mouth every 6 (six) hours as needed for indigestion or heartburn.    [provider]  divalproex (DEPAKOTE SPRINKLE) 125 MG capsule Take 250 mg by mouth 3 (three) times daily.     [provider]  estradiol (ESTRACE) 0.5 MG tablet Take 0.5 mg by mouth daily.    [provider]  guaifenesin (ROBITUSSIN) 100 MG/5ML syrup Take 200 mg by mouth every 6 (six) hours as needed for cough.    [provider]  loperamide (IMODIUM) 2 MG  capsule Take 2 mg by mouth every 3 (three) hours as needed for diarrhea or loose stools.     [provider]  LORazepam (ATIVAN) 0.5 MG tablet Take 0.5 mg by mouth 2 (two) times daily.     [provider]  magnesium hydroxide (MILK OF MAGNESIA) 400 MG/5ML suspension Take 30 mLs by mouth at bedtime as needed for mild constipation.     [provider]  memantine (NAMENDA XR) 28 MG CP24 24 hr capsule Take 28 mg by mouth daily.     [provider]  neomycin-bacitracin-polymyxin (NEOSPORIN) ointment Apply 1 application topically every 3 (three) hours as needed (for skin tears/abrasions). apply to eye     [provider]  ondansetron (ZOFRAN-ODT) 8 MG disintegrating tablet Take 8 mg by mouth 3 (three) times daily as needed for nausea or vomiting.    [provider]  PARoxetine (PAXIL) 40 MG tablet Take 40 mg by mouth at bedtime.    [provider]  QUEtiapine (SEROQUEL) 50 MG tablet Take 1 tablet (50 mg total) by mouth 2 (two) times daily. 01/23/16   Narda BondsNettey, Ralph A, MD  traZODone (DESYREL) 150 MG tablet Take 150 mg by mouth at bedtime.    [provider]    Family History Family History  Problem Relation Age of Onset  . Dementia Sister  Social History Social History  Substance Use Topics  . Smoking status: Former Games developer  . Smokeless tobacco: Never Used  . Alcohol use No     Allergies   Zolpidem   Review of Systems Review of Systems  Unable to perform ROS: Dementia     Physical Exam Updated Vital Signs BP (!) 173/158 (BP Location: Left Arm) Comment: Pt was moving around when BP was taken.  Pulse 68   Temp 97.4 F (36.3 C) (Axillary)   Resp 12   Ht 5\' 9"  (1.753 m)   Wt 63.5 kg (140 lb)   SpO2 96%   BMI 20.67 kg/m   Physical Exam  Constitutional: He appears well-developed and well-nourished. No distress.  Frail, elderly male resting comfortably  HENT:  Head: Normocephalic and atraumatic.  Eyes:  Conjunctivae are normal. Pupils are equal, round, and reactive to light.  Neck:  In c-collar  Cardiovascular: Normal rate, regular rhythm and normal heart sounds.   No murmur heard. Pulmonary/Chest: Effort normal and breath sounds normal. No respiratory distress. He exhibits no tenderness.  Abdominal: Soft. Bowel sounds are normal. He exhibits no distension. There is no tenderness.  Musculoskeletal: He exhibits no edema, tenderness or deformity.  Neurological: He has normal reflexes.  Oriented to person only, not willing/able to follow commands  Skin: Skin is warm and dry.  3cm skin tear dorsal L hand  Nursing note and vitals reviewed.    ED Treatments / Results  Labs (all labs ordered are listed, but only abnormal results are displayed) Labs Reviewed - No data to display  EKG  EKG Interpretation None       Radiology Ct Head Wo Contrast  Result Date: 10/12/2016 CLINICAL DATA:  Recent fall with headaches and neck pain, initial encounter EXAM: CT HEAD WITHOUT CONTRAST CT CERVICAL SPINE WITHOUT CONTRAST TECHNIQUE: Multidetector CT imaging of the head and cervical spine was performed following the standard protocol without intravenous contrast. Multiplanar CT image reconstructions of the cervical spine were also generated. COMPARISON:  09/11/2016 FINDINGS: CT HEAD FINDINGS Brain: Atrophic changes and scattered chronic white matter ischemic change is again identified and stable. Stable prior right frontal infarct is noted. No acute hemorrhage, acute infarction or space-occupying mass lesion is seen. Vascular: No hyperdense vessel or unexpected calcification. Skull: Normal. Negative for fracture or focal lesion. Sinuses/Orbits: No acute finding. Other: None. CT CERVICAL SPINE FINDINGS Alignment: Within normal limits Skull base and vertebrae: 7 cervical segments are well visualized. Disc space narrowing is noted from C3-C7 with anterior and posterior osteophytes. Facet hypertrophic changes are  noted. No acute fracture or acute facet abnormality is seen. Soft tissues and spinal canal: No prevertebral fluid or swelling. No visible canal hematoma. Disc levels: Multilevel disc space narrowing is noted as described above. Upper chest: Negative. IMPRESSION: CT of the head: Chronic atrophic and ischemic changes without acute abnormality. CT of the cervical spine: Multilevel degenerative change without acute abnormality. Electronically Signed   By: Alcide Clever M.D.   On: 10/12/2016 15:06   Ct Cervical Spine Wo Contrast  Result Date: 10/12/2016 CLINICAL DATA:  Recent fall with headaches and neck pain, initial encounter EXAM: CT HEAD WITHOUT CONTRAST CT CERVICAL SPINE WITHOUT CONTRAST TECHNIQUE: Multidetector CT imaging of the head and cervical spine was performed following the standard protocol without intravenous contrast. Multiplanar CT image reconstructions of the cervical spine were also generated. COMPARISON:  09/11/2016 FINDINGS: CT HEAD FINDINGS Brain: Atrophic changes and scattered chronic white matter ischemic change is again identified and  stable. Stable prior right frontal infarct is noted. No acute hemorrhage, acute infarction or space-occupying mass lesion is seen. Vascular: No hyperdense vessel or unexpected calcification. Skull: Normal. Negative for fracture or focal lesion. Sinuses/Orbits: No acute finding. Other: None. CT CERVICAL SPINE FINDINGS Alignment: Within normal limits Skull base and vertebrae: 7 cervical segments are well visualized. Disc space narrowing is noted from C3-C7 with anterior and posterior osteophytes. Facet hypertrophic changes are noted. No acute fracture or acute facet abnormality is seen. Soft tissues and spinal canal: No prevertebral fluid or swelling. No visible canal hematoma. Disc levels: Multilevel disc space narrowing is noted as described above. Upper chest: Negative. IMPRESSION: CT of the head: Chronic atrophic and ischemic changes without acute abnormality. CT  of the cervical spine: Multilevel degenerative change without acute abnormality. Electronically Signed   By: Alcide Clever M.D.   On: 10/12/2016 15:06    Procedures Procedures (including critical care time)  Medications Ordered in ED Medications  bacitracin ointment 1 application (1 application Topical Given 10/12/16 1550)     Initial Impression / Assessment and Plan / ED Course  I have reviewed the triage vital signs and the nursing notes.  Pertinent imaging results that were available during my care of the patient were reviewed by me and considered in my medical decision making (see chart for details).     PT w/ fall at nursing facility, Comfortable on Exam, vital signs notable for hypertension. He had a skin tear but otherwise no traumatic injuries. CT of head and C-spine negative acute. Tetanus is up-to-date according to his chart. Given no signs of discomfort and no other areas of trauma on exam, I feel he is safe for discharge. Patient discharged back to his nursing facility.  Final Clinical Impressions(s) / ED Diagnoses   Final diagnoses:  Fall, initial encounter  Skin tear of left hand without complication, initial encounter    New Prescriptions New Prescriptions   No medications on file     Machai Desmith, Ambrose Finland, MD 10/12/16 [phone]

## 2016-10-12 NOTE — ED Triage Notes (Signed)
[facility]. Antonio SeatFell forward as he was exiting a w/c striking his forehead, where he has a sm. Area of edema and abrasion; also he has a skin tear on his post. Left wrist. He is awake, alert and confused at his baseline.

## 2016-10-12 NOTE — ED Notes (Signed)
[facility] called for transport.  

## 2016-10-17 ENCOUNTER — Encounter (HOSPITAL_COMMUNITY): Payer: Self-pay | Admitting: Emergency Medicine

## 2016-10-17 ENCOUNTER — Emergency Department (HOSPITAL_COMMUNITY)
Admission: EM | Admit: 2016-10-17 | Discharge: 2016-10-18 | Disposition: A | Discharge status: Home / Self Care | Payer: Medicare HMO | Attending: Emergency Medicine | Admitting: Emergency Medicine

## 2016-10-17 DIAGNOSIS — Z79899 Other long term (current) drug therapy: Secondary | ICD-10-CM | POA: Insufficient documentation

## 2016-10-17 DIAGNOSIS — Y92129 Unspecified place in nursing home as the place of occurrence of the external cause: Secondary | ICD-10-CM | POA: Insufficient documentation

## 2016-10-17 DIAGNOSIS — Z043 Encounter for examination and observation following other accident: Secondary | ICD-10-CM | POA: Diagnosis not present

## 2016-10-17 DIAGNOSIS — W06XXXA Fall from bed, initial encounter: Secondary | ICD-10-CM | POA: Insufficient documentation

## 2016-10-17 DIAGNOSIS — W19XXXA Unspecified fall, initial encounter: Secondary | ICD-10-CM

## 2016-10-17 DIAGNOSIS — G309 Alzheimer's disease, unspecified: Secondary | ICD-10-CM | POA: Insufficient documentation

## 2016-10-17 DIAGNOSIS — Z87891 Personal history of nicotine dependence: Secondary | ICD-10-CM | POA: Diagnosis not present

## 2016-10-17 DIAGNOSIS — Y999 Unspecified external cause status: Secondary | ICD-10-CM | POA: Diagnosis not present

## 2016-10-17 DIAGNOSIS — Y939 Activity, unspecified: Secondary | ICD-10-CM | POA: Diagnosis not present

## 2016-10-17 NOTE — ED Triage Notes (Addendum)
[facility] after a fall from bed to floor no signs of obvious injury pt denies pain and is reported to be at his base line per EMS, VS BP=132/80 HR=80 resp=18 CBG=168

## 2016-10-17 NOTE — ED Notes (Signed)
[facility] Expected date:  Expected time:  Means of arrival:  Comments: 76 yr old, fall out of bed

## 2016-10-18 ENCOUNTER — Emergency Department (HOSPITAL_COMMUNITY): Payer: Medicare HMO

## 2016-10-18 NOTE — ED Provider Notes (Signed)
[facility]-EMERGENCY DEPT Provider Note   CSN: [id number] Arrival date & time: 10/17/16  2320   By signing my name below, I, Clarisse Gouge, attest that this documentation has been prepared under the direction and in the presence of Dione Booze, MD. Electronically signed, Clarisse Gouge, ED Scribe. 10/18/16. 12:32 AM.   History   Chief Complaint Chief Complaint  Patient presents with  . Fall   LEVEL 5 CAVEAT: HPI and ROS limited due to dementia   The history is provided by the patient and medical records. No language interpreter was used.    Antonio Duran is a 76 y.o. male with h/o alzheimer's disease BIB EMS from [facility] to the Emergency Department S/P a witness fall that occurred PTA last night. Pt allegedly fell from the bed to the floor; no obvious injury noted per EMS. Pt allegedly baseline per EMS. Pt does not answer questions on evaluation.  Past Medical History:  Diagnosis Date  . Alzheimer's dementia   . Enterococcus UTI     Patient Active Problem List   Diagnosis Date Noted  . Mixed dementia   . Palliative care by specialist   . Agitation   . Insomnia   . Enterococcus UTI 01/18/2016  . Confusion 01/18/2016  . Dementia with behavioral disturbance 01/18/2016    History reviewed. No pertinent surgical history.     Home Medications    Prior to Admission medications   Medication Sig Start Date End Date Taking? Authorizing Provider  acetaminophen (TYLENOL) 500 MG tablet Take 500 mg by mouth every 4 (four) hours as needed for mild pain, moderate pain, fever or headache.     [provider]  alum & mag hydroxide-simeth (MAALOX/MYLANTA) 200-200-20 MG/5ML suspension Take 30 mLs by mouth every 6 (six) hours as needed for indigestion or heartburn.    [provider]  divalproex (DEPAKOTE SPRINKLE) 125 MG capsule Take 250 mg by mouth 3 (three) times daily.     [provider]  estradiol (ESTRACE) 0.5 MG tablet Take 0.5 mg by mouth daily.     [provider]  guaifenesin (ROBITUSSIN) 100 MG/5ML syrup Take 200 mg by mouth every 6 (six) hours as needed for cough.    [provider]  loperamide (IMODIUM) 2 MG capsule Take 2 mg by mouth every 3 (three) hours as needed for diarrhea or loose stools.     [provider]  LORazepam (ATIVAN) 0.5 MG tablet Take 0.5 mg by mouth 2 (two) times daily.     [provider]  magnesium hydroxide (MILK OF MAGNESIA) 400 MG/5ML suspension Take 30 mLs by mouth at bedtime as needed for mild constipation.     [provider]  memantine (NAMENDA XR) 28 MG CP24 24 hr capsule Take 28 mg by mouth daily.     [provider]  neomycin-bacitracin-polymyxin (NEOSPORIN) ointment Apply 1 application topically every 3 (three) hours as needed (for skin tears/abrasions). apply to eye     [provider]  ondansetron (ZOFRAN-ODT) 8 MG disintegrating tablet Take 8 mg by mouth 3 (three) times daily as needed for nausea or vomiting.    [provider]  PARoxetine (PAXIL) 40 MG tablet Take 40 mg by mouth at bedtime.    [provider]  QUEtiapine (SEROQUEL) 50 MG tablet Take 1 tablet (50 mg total) by mouth 2 (two) times daily. 01/23/16   [name], MD  traZODone (DESYREL) 150 MG tablet Take 150 mg by mouth at bedtime.  [provider]    Family History Family History  Problem Relation Age of Onset  . Dementia Sister     Social History Social History  Substance Use Topics  . Smoking status: Former Games developer  . Smokeless tobacco: Never Used  . Alcohol use No     Allergies   Zolpidem   Review of Systems Review of Systems  Unable to perform ROS: Dementia     Physical Exam Updated Vital Signs BP (!) 155/100   Pulse 67   Temp 97.9 F (36.6 C) (Oral)   Resp 18   SpO2 95%   Physical Exam  Constitutional: He appears well-developed and well-nourished.  HENT:  Head: Normocephalic and atraumatic.  Eyes: EOM are  normal. Pupils are equal, round, and reactive to light.  Neck: No JVD present.  Immobilized with a towel roll, nontender  Cardiovascular: Normal rate, regular rhythm and normal heart sounds.  Exam reveals no gallop.   No murmur heard. Pulmonary/Chest: Effort normal. No respiratory distress. He has no wheezes. He has no rales. He exhibits no tenderness.  Abdominal: Soft. Bowel sounds are normal. He exhibits no mass. There is no tenderness.  Musculoskeletal: Normal range of motion. He exhibits no edema or deformity.  Lymphadenopathy:    He has no cervical adenopathy.  Neurological: He is alert. No cranial nerve deficit or sensory deficit. He exhibits normal muscle tone.  Not oriented to person, place or time. Is verbal but does not answer questions.  Skin: Skin is warm and dry. No rash noted.     ED Treatments / Results  DIAGNOSTIC STUDIES: Oxygen Saturation is 95% on room air, normal by my interpretation.    Radiology Ct Head Wo Contrast  Result Date: 10/18/2016 CLINICAL DATA:  Witnessed fall from bed to floor without obvious injuries. History of Alzheimer's. EXAM: CT HEAD WITHOUT CONTRAST CT CERVICAL SPINE WITHOUT CONTRAST TECHNIQUE: Multidetector CT imaging of the head and cervical spine was performed following the standard protocol without intravenous contrast. Multiplanar CT image reconstructions of the cervical spine were also generated. COMPARISON:  10/12/2016 FINDINGS: CT HEAD FINDINGS Brain: Stable cerebral atrophy without acute intracranial hemorrhage, midline shift or edema. Moderate degree of small vessel ischemia in the periventricular and subcortical white matter. Stable small chronic right frontal infarct. No large vascular territory infarction. No extra-axial fluid collections or intra-axial masses. Vascular: Moderate atherosclerosis of the carotid siphons bilaterally as well as both vertebral arteries. No hyperdense vessels. Skull: No skull fracture. Sinuses/Orbits: Ethmoid sinus  mucosal thickening as well as thickening in the left maxillary sinus. Mastoids are clear. The included orbits and globes are nonacute. Other: None CT CERVICAL SPINE FINDINGS Alignment: Maintained cervical lordosis. Intact craniocervical relationship. The atlantodental interval is osteoarthritic. Skull base and vertebrae: No vertebral body fracture. No cerebellar tonsillar ectopia. Soft tissues and spinal canal: No prevertebral soft tissue swelling. No intraspinal hemorrhage or significant canal stenosis. Disc levels: There is degenerative disc disease from C3 through C7 with small posterior marginal osteophytes most pronounced at C3-4. Multilevel degenerative facet arthropathy without jumped facets. Upper chest: Negative Other: None IMPRESSION: 1. No acute intracranial nor cervical spine abnormality. 2. Chronic cerebral atrophy with moderate degree of small vessel ischemia. Chronic small right frontal lobe infarct. 3. 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 Electronically Signed   By: Tollie Eth M.D.   On: 10/18/2016 01:42   Ct Cervical Spine Wo Contrast  Result Date: 10/18/2016 CLINICAL DATA:  Witnessed fall from bed to floor  without obvious injuries. History of Alzheimer's. EXAM: CT HEAD WITHOUT CONTRAST CT CERVICAL SPINE WITHOUT CONTRAST TECHNIQUE: Multidetector CT imaging of the head and cervical spine was performed following the standard protocol without intravenous contrast. Multiplanar CT image reconstructions of the cervical spine were also generated. COMPARISON:  10/12/2016 FINDINGS: CT HEAD FINDINGS Brain: Stable cerebral atrophy without acute intracranial hemorrhage, midline shift or edema. Moderate degree of small vessel ischemia in the periventricular and subcortical white matter. Stable small chronic right frontal infarct. No large vascular territory infarction. No extra-axial fluid collections or intra-axial masses. Vascular: Moderate atherosclerosis of the  carotid siphons bilaterally as well as both vertebral arteries. No hyperdense vessels. Skull: No skull fracture. Sinuses/Orbits: Ethmoid sinus mucosal thickening as well as thickening in the left maxillary sinus. Mastoids are clear. The included orbits and globes are nonacute. Other: None CT CERVICAL SPINE FINDINGS Alignment: Maintained cervical lordosis. Intact craniocervical relationship. The atlantodental interval is osteoarthritic. Skull base and vertebrae: No vertebral body fracture. No cerebellar tonsillar ectopia. Soft tissues and spinal canal: No prevertebral soft tissue swelling. No intraspinal hemorrhage or significant canal stenosis. Disc levels: There is degenerative disc disease from C3 through C7 with small posterior marginal osteophytes most pronounced at C3-4. Multilevel degenerative facet arthropathy without jumped facets. Upper chest: Negative Other: None IMPRESSION: 1. No acute intracranial nor cervical spine abnormality. 2. Chronic cerebral atrophy with moderate degree of small vessel ischemia. Chronic small right frontal lobe infarct. 3. 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 Electronically Signed   By: Tollie Ethavid  Kwon M.D.   On: 10/18/2016 01:42    Procedures Procedures (including critical care time)  Medications Ordered in ED Medications - No data to display   Initial Impression / Assessment and Plan / ED Course  I have reviewed the triage vital signs and the nursing notes.  Pertinent labs & imaging results that were available during my care of the patient were reviewed by me and considered in my medical decision making (see chart for details).  Fall at nursing home without apparent injury. Old records are reviewed, and he has multiple ED visits for falls. He is sent for CT of head and cervical spine which show no acute injury. He is discharged back to his nursing care facility.  Final Clinical Impressions(s) / ED Diagnoses   Final  diagnoses:  Fall at nursing home, initial encounter    New Prescriptions New Prescriptions   No medications on file    I personally performed the services described in this documentation, which was scribed in my presence. The recorded information has been reviewed and is accurate.       Dione BoozeGlick, Natiya Seelinger, MD 10/18/16 [phone]

## 2016-10-18 NOTE — ED Notes (Signed)
[facility] called for transportation  

## 2016-10-18 NOTE — ED Notes (Signed)
Attempted to call report, no answer at facility, no answering machine to leave message.

## 2016-10-18 NOTE — ED Notes (Signed)
Patient transported to CT 

## 2016-10-20 ENCOUNTER — Emergency Department (HOSPITAL_COMMUNITY)
Admission: EM | Admit: 2016-10-20 | Discharge: 2016-10-21 | Disposition: A | Discharge status: Skilled Nursing Facility | Payer: Medicare HMO | Attending: Emergency Medicine | Admitting: Emergency Medicine

## 2016-10-20 DIAGNOSIS — Y92129 Unspecified place in nursing home as the place of occurrence of the external cause: Secondary | ICD-10-CM | POA: Diagnosis not present

## 2016-10-20 DIAGNOSIS — S51011A Laceration without foreign body of right elbow, initial encounter: Secondary | ICD-10-CM

## 2016-10-20 DIAGNOSIS — Y939 Activity, unspecified: Secondary | ICD-10-CM | POA: Diagnosis not present

## 2016-10-20 DIAGNOSIS — Z79899 Other long term (current) drug therapy: Secondary | ICD-10-CM | POA: Insufficient documentation

## 2016-10-20 DIAGNOSIS — W050XXA Fall from non-moving wheelchair, initial encounter: Secondary | ICD-10-CM | POA: Diagnosis not present

## 2016-10-20 DIAGNOSIS — Y999 Unspecified external cause status: Secondary | ICD-10-CM | POA: Insufficient documentation

## 2016-10-20 DIAGNOSIS — G309 Alzheimer's disease, unspecified: Secondary | ICD-10-CM | POA: Diagnosis not present

## 2016-10-20 DIAGNOSIS — Z87891 Personal history of nicotine dependence: Secondary | ICD-10-CM | POA: Insufficient documentation

## 2016-10-20 DIAGNOSIS — S51001A Unspecified open wound of right elbow, initial encounter: Secondary | ICD-10-CM | POA: Insufficient documentation

## 2016-10-20 DIAGNOSIS — W19XXXA Unspecified fall, initial encounter: Secondary | ICD-10-CM

## 2016-10-20 NOTE — ED Provider Notes (Signed)
[facility]-EMERGENCY DEPT Provider Note   CSN: [id number] Arrival date & time: 10/20/16  1054     History   Chief Complaint Chief Complaint  Patient presents with  . Fall   Level V caveat: Altered mental status  HPI Nathen Balaban is a 76 y.o. male.  HPI Patient presents to the emergency department with complaints of fall from the nursing home.  He has dementia and cannot provide any additional history.  EMS reported baseline mental status.  Patient's had multiple falls.  He is not on anticoagulants.  Small skin tear was noted by EMS to the right elbow.  Small abrasion to the right forehead without hematoma was noted by EMS.  As reported by the nursing home that he has had no recent illness or changes          Past Medical History:  Diagnosis Date  . Alzheimer's dementia   . Enterococcus UTI     Patient Active Problem List   Diagnosis Date Noted  . Mixed dementia   . Palliative care by specialist   . Agitation   . Insomnia   . Enterococcus UTI 01/18/2016  . Confusion 01/18/2016  . Dementia with behavioral disturbance 01/18/2016    No past surgical history on file.     Home Medications    Prior to Admission medications   Medication Sig Start Date End Date Taking? Authorizing Provider  acetaminophen (TYLENOL) 500 MG tablet Take 500 mg by mouth every 4 (four) hours as needed for mild pain, moderate pain, fever or headache.     [provider]  alum & mag hydroxide-simeth (MAALOX/MYLANTA) 200-200-20 MG/5ML suspension Take 30 mLs by mouth every 6 (six) hours as needed for indigestion or heartburn.    [provider]  divalproex (DEPAKOTE SPRINKLE) 125 MG capsule Take 250 mg by mouth 3 (three) times daily.     [provider]  estradiol (ESTRACE) 0.5 MG tablet Take 0.5 mg by mouth daily.    [provider]  guaifenesin (ROBITUSSIN) 100 MG/5ML syrup Take 200 mg by mouth every 6 (six) hours as needed for cough.    [provider]  loperamide (IMODIUM) 2 MG capsule Take 2 mg by mouth every 3 (three) hours as needed for diarrhea or loose stools.     [provider]  LORazepam (ATIVAN) 0.5 MG tablet Take 0.5 mg by mouth 2 (two) times daily.     [provider]  magnesium hydroxide (MILK OF MAGNESIA) 400 MG/5ML suspension Take 30 mLs by mouth at bedtime as needed for mild constipation.     [provider]  memantine (NAMENDA XR) 28 MG CP24 24 hr capsule Take 28 mg by mouth daily.     [provider]  neomycin-bacitracin-polymyxin (NEOSPORIN) ointment Apply 1 application topically every 3 (three) hours as needed (for skin tears/abrasions). apply to eye     [provider]  ondansetron (ZOFRAN-ODT) 8 MG disintegrating tablet Take 8 mg by mouth 3 (three) times daily as needed for nausea or vomiting.    [provider]  PARoxetine (PAXIL) 40 MG tablet Take 40 mg by mouth at bedtime.    [provider]  QUEtiapine (SEROQUEL) 50 MG tablet Take 1 tablet (50 mg total) by mouth 2 (two) times daily. 01/23/16   [name], MD  traZODone (DESYREL) 150 MG tablet Take 150 mg by mouth at bedtime.    [provider]    Family History Family History  Problem Relation Age  of Onset  . Dementia Sister     Social History Social History  Substance Use Topics  . Smoking status: Former Games developermoker  . Smokeless tobacco: Never Used  . Alcohol use No     Allergies   Zolpidem   Review of Systems Review of Systems  Unable to perform ROS: Dementia     Physical Exam Updated Vital Signs BP (!) 154/97 (BP Location: Left Arm)   Pulse 60   Temp 97.4 F (36.3 C) (Oral)   Resp 18   Ht 5\' 9"  (1.753 m)   Wt 63.5 kg (140 lb)   SpO2 92%   BMI 20.67 kg/m   Physical Exam  Constitutional: He appears well-developed and well-nourished.  HENT:  Head: Normocephalic.  Superficial abrasion without significant hematoma to the right periorbital region. Extra  ocular movements are normal  Eyes: EOM are normal.  Neck: Normal range of motion.  C-spine nontender  Pulmonary/Chest: Effort normal.  Abdominal: He exhibits no distension.  Musculoskeletal:  Small skin tear to the right lateral elbow with full range of motion.  Full range of motion bilateral shoulders, wrists, left elbow.  Full range of motion bilateral hips, knees, ankles.  Neurological: He is alert.  Follows commands.  Moves all 4 extremity is equally.  Grip strength normal bilaterally  Psychiatric: He has a normal mood and affect.  Nursing note and vitals reviewed.    ED Treatments / Results  Labs (all labs ordered are listed, but only abnormal results are displayed) Labs Reviewed - No data to display  EKG  EKG Interpretation None       Radiology No results found.  Procedures Procedures (including critical care time)  Medications Ordered in ED Medications - No data to display   Initial Impression / Assessment and Plan / ED Course  I have reviewed the triage vital signs and the nursing notes.  Pertinent labs & imaging results that were available during my care of the patient were reviewed by me and considered in my medical decision making (see chart for details).     No hematoma.  No anticoagulation.  I do not think the patient needs another head CT.  His C-spine is nontender.  He's got full range of motion of all major joints.  Final Clinical Impressions(s) / ED Diagnoses   Final diagnoses:  Fall at nursing home, initial encounter  Skin tear of right elbow without complication, initial encounter    New Prescriptions New Prescriptions   No medications on file     Azalia Bilisampos, Marilouise Densmore, MD 10/20/16 1145

## 2016-10-20 NOTE — ED Notes (Signed)
Bed: [id number] Expected date:  Expected time:  Means of arrival:  Comments: 76 yo fall, eye lac

## 2016-10-20 NOTE — ED Triage Notes (Signed)
Pt from [facility] sent for evaluation after a fall from his wheelchair. Pt have right eyelid laceration and right elbow laceration. Pt not on blood thinners and pt also have hx dementia.

## 2016-10-20 NOTE — ED Notes (Signed)
Bed alarm on bed because pt trying to get out the bed. Pt have hx of dementia.

## 2016-10-20 NOTE — ED Notes (Signed)
[facility] call to transport patient back [facility].

## 2016-10-21 ENCOUNTER — Encounter (HOSPITAL_COMMUNITY): Payer: Self-pay | Admitting: Emergency Medicine

## 2016-10-21 ENCOUNTER — Emergency Department (HOSPITAL_COMMUNITY)
Admission: EM | Admit: 2016-10-21 | Discharge: 2016-10-21 | Disposition: A | Discharge status: Home / Self Care | Payer: Medicare HMO | Source: Home / Self Care | Attending: Emergency Medicine | Admitting: Emergency Medicine

## 2016-10-21 DIAGNOSIS — Y999 Unspecified external cause status: Secondary | ICD-10-CM | POA: Insufficient documentation

## 2016-10-21 DIAGNOSIS — Y939 Activity, unspecified: Secondary | ICD-10-CM | POA: Insufficient documentation

## 2016-10-21 DIAGNOSIS — Z8659 Personal history of other mental and behavioral disorders: Secondary | ICD-10-CM

## 2016-10-21 DIAGNOSIS — G309 Alzheimer's disease, unspecified: Secondary | ICD-10-CM | POA: Insufficient documentation

## 2016-10-21 DIAGNOSIS — F0280 Dementia in other diseases classified elsewhere without behavioral disturbance: Secondary | ICD-10-CM

## 2016-10-21 DIAGNOSIS — Z79899 Other long term (current) drug therapy: Secondary | ICD-10-CM | POA: Insufficient documentation

## 2016-10-21 DIAGNOSIS — Z87891 Personal history of nicotine dependence: Secondary | ICD-10-CM | POA: Insufficient documentation

## 2016-10-21 DIAGNOSIS — S51812A Laceration without foreign body of left forearm, initial encounter: Secondary | ICD-10-CM

## 2016-10-21 DIAGNOSIS — Y929 Unspecified place or not applicable: Secondary | ICD-10-CM | POA: Insufficient documentation

## 2016-10-21 DIAGNOSIS — W1830XA Fall on same level, unspecified, initial encounter: Secondary | ICD-10-CM | POA: Insufficient documentation

## 2016-10-21 DIAGNOSIS — W19XXXA Unspecified fall, initial encounter: Secondary | ICD-10-CM

## 2016-10-21 NOTE — ED Triage Notes (Signed)
[facility]observed [facility] from a standing position.  Pt has skin tears on left arm.

## 2016-10-21 NOTE — ED Notes (Signed)
Bed: [facility] Expected date:  Expected time:  Means of arrival:  Comments: EMS-FALL 

## 2016-10-21 NOTE — ED Notes (Signed)
Dressing applied to skin tears on left arm.  PT[facility] notified of need for Pt transportation.

## 2016-10-21 NOTE — ED Provider Notes (Signed)
[facility]-EM[facility] DEPT Provider Note   CSN: [id number] Arrival date & time: 10/21/16  1457     History   Chief Complaint Chief Complaint  Patient presents with  . Fall    HPI Antonio Duran is a 76 y.o. male.  Patient is a 76 year old male with past medical history of dementia. He was sent from his extended care facility for evaluation of a fall. Patient appears to have frequent falls that he has seen for. The exact details of this fall are unclear to me and the patient adds no additional history secondary to his dementia. He does tell me that nothing hurts and has no complaints.   The history is provided by the patient.  Fall  This is a new problem. The current episode started 1 to 2 hours ago. The problem occurs constantly. The problem has not changed since onset.The symptoms are aggravated by intercourse. Nothing relieves the symptoms.    Past Medical History:  Diagnosis Date  . Alzheimer's dementia   . Enterococcus UTI     Patient Active Problem List   Diagnosis Date Noted  . Mixed dementia   . Palliative care by specialist   . Agitation   . Insomnia   . Enterococcus UTI 01/18/2016  . Confusion 01/18/2016  . Dementia with behavioral disturbance 01/18/2016    History reviewed. No pertinent surgical history.     Home Medications    Prior to Admission medications   Medication Sig Start Date End Date Taking? Authorizing Provider  acetaminophen (TYLENOL) 500 MG tablet Take 500 mg by mouth every 4 (four) hours as needed for mild pain, moderate pain, fever or headache.     [provider]  alum & mag hydroxide-simeth (MAALOX/MYLANTA) 200-200-20 MG/5ML suspension Take 30 mLs by mouth every 6 (six) hours as needed for indigestion or heartburn.    [provider]  divalproex (DEPAKOTE SPRINKLE) 125 MG capsule Take 250 mg by mouth 3 (three) times daily.     [provider]  estradiol (ESTRACE) 0.5 MG tablet Take 0.5 mg by mouth daily.     [provider]  guaifenesin (ROBITUSSIN) 100 MG/5ML syrup Take 200 mg by mouth every 6 (six) hours as needed for cough.    [provider]  loperamide (IMODIUM) 2 MG capsule Take 2 mg by mouth every 3 (three) hours as needed for diarrhea or loose stools.     [provider]  LORazepam (ATIVAN) 0.5 MG tablet Take 0.5 mg by mouth 2 (two) times daily.     [provider]  magnesium hydroxide (MILK OF MAGNESIA) 400 MG/5ML suspension Take 30 mLs by mouth at bedtime as needed for mild constipation.     [provider]  memantine (NAMENDA XR) 28 MG CP24 24 hr capsule Take 28 mg by mouth daily.     [provider]  neomycin-bacitracin-polymyxin (NEOSPORIN) ointment Apply 1 application topically every 3 (three) hours as needed (for skin tears/abrasions). apply to eye     [provider]  ondansetron (ZOFRAN-ODT) 8 MG disintegrating tablet Take 8 mg by mouth 3 (three) times daily as needed for nausea or vomiting.    [provider]  PARoxetine (PAXIL) 40 MG tablet Take 40 mg by mouth at bedtime.    [provider]  QUEtiapine (SEROQUEL) 50 MG tablet Take 1 tablet (50 mg total) by mouth 2 (two) times daily. 01/23/16   Narda BondsNettey, Ralph A, MD  traZODone (DESYREL) 150 MG tablet Take 150 mg by mouth at  bedtime.    [provider]    Family History Family History  Problem Relation Age of Onset  . Dementia Sister     Social History Social History  Substance Use Topics  . Smoking status: Former Games developer  . Smokeless tobacco: Never Used  . Alcohol use No     Allergies   Zolpidem   Review of Systems Review of Systems  All other systems reviewed and are negative.    Physical Exam Updated Vital Signs BP (!) 144/95   Pulse 85   Temp 98.6 F (37 C) (Oral)   Resp 15   SpO2 100%   Physical Exam  Constitutional: He appears well-developed and well-nourished. No distress.  HENT:  Head: Normocephalic and  atraumatic.  Mouth/Throat: Oropharynx is clear and moist.  Eyes: EOM are normal. Pupils are equal, round, and reactive to light.  Neck: Normal range of motion. Neck supple.  Cardiovascular: Normal rate and regular rhythm.  Exam reveals no friction rub.   No murmur heard. Pulmonary/Chest: Effort normal and breath sounds normal. No respiratory distress. He has no wheezes. He has no rales.  Abdominal: Soft. Bowel sounds are normal. He exhibits no distension. There is no tenderness.  Musculoskeletal: Normal range of motion. He exhibits no edema.  There are abrasions to the left forearm along with small skin tears.  He has no pelvic instability and no pain with range of motion of the hips.  There is no obvious deformity or other injury to the extremities.  Neurological: Coordination normal.  Patient is awake and alert, and responds to questions. He is however disoriented to person, place, time, and situation.  Skin: Skin is warm and dry. He is not diaphoretic.  Nursing note and vitals reviewed.    ED Treatments / Results  Labs (all labs ordered are listed, but only abnormal results are displayed) Labs Reviewed - No data to display  EKG  EKG Interpretation None       Radiology No results found.  Procedures Procedures (including critical care time)  Medications Ordered in ED Medications - No data to display   Initial Impression / Assessment and Plan / ED Course  I have reviewed the triage vital signs and the nursing notes.  Pertinent labs & imaging results that were available during my care of the patient were reviewed by me and considered in my medical decision making (see chart for details).  Patient with history of dementia brought for evaluation of fall. I nothing on his exam other than skin tears to the left forearm. There is no deformity or significant swelling and he has full range of motion. I do not feel as though any imaging studies are indicated. He will be  discharged, to follow-up as needed for any problems  Final Clinical Impressions(s) / ED Diagnoses   Final diagnoses:  None    New Prescriptions New Prescriptions   No medications on file     Geoffery Lyons, MD 10/21/16 [phone]

## 2016-10-21 NOTE — ED Triage Notes (Signed)
[facility]  He had an unwitnessed fall from standing while attempting to get into the bathroom.  Pt was found down on the floor.  Pt has a large Hx of falls.  Pt has dementia.  Pt is A&O x1-2 at baseline.

## 2016-10-21 NOTE — Discharge Instructions (Signed)
Continue medications as before.  Fall precautions.  Return to the emergency department if symptoms significantly worsen or change.

## 2016-10-21 NOTE — ED Notes (Signed)
Pt arrived by registration when [facility]  Somehow, the chart from the Pts visit from 10/20/16 was pulled to Pts room and charting was begun on that chart.  When error was discovered, corrections were made.  All triage notes and charting should be time stamped for 1457 which [facility].

## 2016-10-21 NOTE — ED Notes (Addendum)
Charting entered after 2005 10/20/16 was entered in error.  [facility]/16 but was somehow not removed from the ED manager.

## 2016-11-09 ENCOUNTER — Encounter (HOSPITAL_COMMUNITY): Payer: Self-pay | Admitting: *Deleted

## 2016-11-09 ENCOUNTER — Emergency Department (HOSPITAL_COMMUNITY)
Admission: EM | Admit: 2016-11-09 | Discharge: 2016-11-09 | Disposition: A | Discharge status: Home / Self Care | Payer: Medicare HMO | Attending: Emergency Medicine | Admitting: Emergency Medicine

## 2016-11-09 DIAGNOSIS — Z79899 Other long term (current) drug therapy: Secondary | ICD-10-CM | POA: Diagnosis not present

## 2016-11-09 DIAGNOSIS — W2209XA Striking against other stationary object, initial encounter: Secondary | ICD-10-CM | POA: Diagnosis not present

## 2016-11-09 DIAGNOSIS — Z87891 Personal history of nicotine dependence: Secondary | ICD-10-CM | POA: Diagnosis not present

## 2016-11-09 DIAGNOSIS — Y9301 Activity, walking, marching and hiking: Secondary | ICD-10-CM | POA: Insufficient documentation

## 2016-11-09 DIAGNOSIS — G308 Other Alzheimer's disease: Secondary | ICD-10-CM | POA: Insufficient documentation

## 2016-11-09 DIAGNOSIS — Y999 Unspecified external cause status: Secondary | ICD-10-CM | POA: Diagnosis not present

## 2016-11-09 DIAGNOSIS — Y92129 Unspecified place in nursing home as the place of occurrence of the external cause: Secondary | ICD-10-CM | POA: Diagnosis not present

## 2016-11-09 DIAGNOSIS — R4182 Altered mental status, unspecified: Secondary | ICD-10-CM | POA: Diagnosis present

## 2016-11-09 DIAGNOSIS — W19XXXA Unspecified fall, initial encounter: Secondary | ICD-10-CM

## 2016-11-09 NOTE — ED Provider Notes (Signed)
[facility]-EMERGENCY DEPT Provider Note   CSN: [id number] Arrival date & time: 11/09/16  1513     History   Chief Complaint Chief Complaint  Patient presents with  . Fall    HPI Antonio Duran is a 76 y.o. male.  Pt presents to the ED today after a fall after tripping over a wheelchair.  The fall was witnessed by nurses.  He had no loc.  No known injury.  Pt has dementia and is unable to give any hx.      Past Medical History:  Diagnosis Date  . Alzheimer's dementia   . Enterococcus UTI     Patient Active Problem List   Diagnosis Date Noted  . Mixed dementia   . Palliative care by specialist   . Agitation   . Insomnia   . Enterococcus UTI 01/18/2016  . Confusion 01/18/2016  . Dementia with behavioral disturbance 01/18/2016    History reviewed. No pertinent surgical history.     Home Medications    Prior to Admission medications   Medication Sig Start Date End Date Taking? Authorizing Provider  divalproex (DEPAKOTE SPRINKLE) 125 MG capsule Take 500 mg by mouth 3 (three) times daily.    Yes [provider]  estradiol (ESTRACE) 0.5 MG tablet Take 0.5 mg by mouth daily.   Yes [provider]  memantine (NAMENDA XR) 28 MG CP24 24 hr capsule Take 28 mg by mouth daily.    Yes [provider]  PARoxetine (PAXIL) 40 MG tablet Take 40 mg by mouth at bedtime.   Yes [provider]  traZODone (DESYREL) 150 MG tablet Take 75 mg by mouth at bedtime.    Yes [provider]  acetaminophen (TYLENOL) 500 MG tablet Take 500 mg by mouth every 4 (four) hours as needed for mild pain, moderate pain, fever or headache.     [provider]  alum & mag hydroxide-simeth (MAALOX/MYLANTA) 200-200-20 MG/5ML suspension Take 30 mLs by mouth every 6 (six) hours as needed for indigestion or heartburn.    [provider]  guaifenesin (ROBITUSSIN) 100 MG/5ML syrup Take 200 mg by mouth every 6 (six) hours as needed for cough.    [provider]  loperamide (IMODIUM) 2 MG capsule Take 2 mg by mouth every 3 (three) hours as needed for diarrhea or loose stools.     [provider]  magnesium hydroxide (MILK OF MAGNESIA) 400 MG/5ML suspension Take 30 mLs by mouth at bedtime as needed for mild constipation.     [provider]  neomycin-bacitracin-polymyxin (NEOSPORIN) ointment Apply 1 application topically every 3 (three) hours as needed (for skin tears/abrasions). apply to eye     [provider]  QUEtiapine (SEROQUEL) 50 MG tablet Take 1 tablet (50 mg total) by mouth 2 (two) times daily. Patient not taking: Reported on 11/09/2016 01/23/16   [name], MD    Family History Family History  Problem Relation Age of Onset  . Dementia Sister     Social History Social History  Substance Use Topics  . Smoking status: Former Games developer  . Smokeless tobacco: Never Used  . Alcohol use No     Allergies   Zolpidem   Review of Systems Review of Systems  Unable to perform ROS: Dementia     Physical Exam Updated Vital Signs BP 107/86   Pulse (!) 114   Temp 98.6 F (37 C) (Oral)   Resp 18   SpO2 96%   Physical Exam  Constitutional:  He appears well-developed and well-nourished.  HENT:  Head: Normocephalic and atraumatic.  Right Ear: External ear normal.  Left Ear: External ear normal.  Nose: Nose normal.  Mouth/Throat: Oropharynx is clear and moist.  Eyes: Conjunctivae and EOM are normal. Pupils are equal, round, and reactive to light.  Neck: Normal range of motion. Neck supple.  Cardiovascular: Normal rate, regular rhythm, normal heart sounds and intact distal pulses.   Pulmonary/Chest: Effort normal and breath sounds normal.  Abdominal: Soft. Bowel sounds are normal.  Musculoskeletal: Normal range of motion.  Neurological: He is alert.  Not oriented, but this is his norm.  He is moving all 4 extremities.  Skin: Skin is warm.  Psychiatric: He has a normal mood and affect. His  behavior is normal. Judgment and thought content normal.  Nursing note and vitals reviewed.    ED Treatments / Results  Labs (all labs ordered are listed, but only abnormal results are displayed) Labs Reviewed - No data to display  EKG  EKG Interpretation None       Radiology No results found.  Procedures Procedures (including critical care time)  Medications Ordered in ED Medications - No data to display   Initial Impression / Assessment and Plan / ED Course  I have reviewed the triage vital signs and the nursing notes.  Pertinent labs & imaging results that were available during my care of the patient were reviewed by me and considered in my medical decision making (see chart for details).    Pt is able to ambulate.  He has no pain and no obvious injury.  Pt is stable for d/c.  Final Clinical Impressions(s) / ED Diagnoses   Final diagnoses:  Fall, initial encounter    New Prescriptions New Prescriptions   No medications on file     Jacalyn LefevreHaviland, Makaiah Terwilliger, MD 11/09/16 1758

## 2016-11-09 NOTE — ED Notes (Signed)
Pt ambulated with assistance.  

## 2016-11-09 NOTE — ED Notes (Signed)
PT[facility] called  

## 2016-11-09 NOTE — ED Triage Notes (Signed)
Per EMS[facility] for witnessed fall. Pt tripped over a wheelchair in front of the nurses station. No obvious injury noted to pt, pt denies pain. Pt did not lose consciousness, is not on blood thinners.   BP 114/80 HR 90 CBG 164

## 2016-11-09 NOTE — ED Notes (Signed)
Bed: WA08 Expected date: 11/09/16 Expected time: 3:10 PM Means of arrival: Ambulance Comments: fall

## 2017-04-15 ENCOUNTER — Encounter (HOSPITAL_COMMUNITY): Payer: Self-pay | Admitting: Internal Medicine

## 2017-04-15 ENCOUNTER — Emergency Department (HOSPITAL_COMMUNITY)
Admission: EM | Admit: 2017-04-15 | Discharge: 2017-04-15 | Disposition: A | Discharge status: Skilled Nursing Facility | Payer: Medicare HMO | Attending: Emergency Medicine | Admitting: Emergency Medicine

## 2017-04-15 DIAGNOSIS — Y939 Activity, unspecified: Secondary | ICD-10-CM | POA: Insufficient documentation

## 2017-04-15 DIAGNOSIS — S51811A Laceration without foreign body of right forearm, initial encounter: Secondary | ICD-10-CM | POA: Insufficient documentation

## 2017-04-15 DIAGNOSIS — Y999 Unspecified external cause status: Secondary | ICD-10-CM | POA: Insufficient documentation

## 2017-04-15 DIAGNOSIS — Y92129 Unspecified place in nursing home as the place of occurrence of the external cause: Secondary | ICD-10-CM | POA: Diagnosis not present

## 2017-04-15 DIAGNOSIS — G309 Alzheimer's disease, unspecified: Secondary | ICD-10-CM | POA: Insufficient documentation

## 2017-04-15 DIAGNOSIS — Z79899 Other long term (current) drug therapy: Secondary | ICD-10-CM | POA: Insufficient documentation

## 2017-04-15 MED ORDER — HALOPERIDOL LACTATE 5 MG/ML IJ SOLN
5.0000 mg | Freq: Once | INTRAMUSCULAR | Status: AC
  Administered 2017-04-15: 5 mg via INTRAMUSCULAR
  Filled 2017-04-15: qty 1

## 2017-04-15 MED ORDER — LORAZEPAM 2 MG/ML IJ SOLN
INTRAMUSCULAR | Status: AC
  Administered 2017-04-15: 2 mg
  Filled 2017-04-15: qty 1

## 2017-04-15 MED ORDER — BACITRACIN ZINC 500 UNIT/GM EX OINT: TOPICAL_OINTMENT | Freq: Two times a day (BID) | CUTANEOUS | Status: DC

## 2017-04-15 NOTE — ED Notes (Signed)
Dressed patient's wound. PTAR notified of need for transport.
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
## 2017-04-15 NOTE — ED Notes (Signed)
Patient refusing to get back in bed

## 2017-04-15 NOTE — ED Notes (Signed)
Pt unable to sign discharge instructions. 
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
## 2017-04-15 NOTE — ED Notes (Signed)
[facility] Expected date:  Expected time:  Means of arrival:  Comments: EMS-dementia

## 2017-04-15 NOTE — ED Notes (Signed)
Patient will not stay in bed while waiting for PTAR. He is being noncompliant and is not listening to staff. The patient is flirting with staff and attempting to tough staff inappropriately.
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
## 2017-04-30 ENCOUNTER — Encounter (HOSPITAL_COMMUNITY): Payer: Self-pay

## 2017-04-30 ENCOUNTER — Other Ambulatory Visit: Payer: Self-pay

## 2017-04-30 ENCOUNTER — Emergency Department (HOSPITAL_COMMUNITY)
Admission: EM | Admit: 2017-04-30 | Discharge: 2017-05-01 | Disposition: A | Discharge status: Home / Self Care | Payer: Medicare HMO | Attending: Emergency Medicine | Admitting: Emergency Medicine

## 2017-04-30 DIAGNOSIS — F0281 Dementia in other diseases classified elsewhere with behavioral disturbance: Secondary | ICD-10-CM | POA: Insufficient documentation

## 2017-04-30 DIAGNOSIS — Z87891 Personal history of nicotine dependence: Secondary | ICD-10-CM | POA: Insufficient documentation

## 2017-04-30 DIAGNOSIS — G308 Other Alzheimer's disease: Secondary | ICD-10-CM | POA: Diagnosis not present

## 2017-04-30 DIAGNOSIS — W01198A Fall on same level from slipping, tripping and stumbling with subsequent striking against other object, initial encounter: Secondary | ICD-10-CM | POA: Insufficient documentation

## 2017-04-30 DIAGNOSIS — S098XXA Other specified injuries of head, initial encounter: Secondary | ICD-10-CM | POA: Diagnosis not present

## 2017-04-30 DIAGNOSIS — Y9389 Activity, other specified: Secondary | ICD-10-CM | POA: Insufficient documentation

## 2017-04-30 DIAGNOSIS — S0990XA Unspecified injury of head, initial encounter: Secondary | ICD-10-CM | POA: Diagnosis present

## 2017-04-30 DIAGNOSIS — Y92129 Unspecified place in nursing home as the place of occurrence of the external cause: Secondary | ICD-10-CM | POA: Insufficient documentation

## 2017-04-30 DIAGNOSIS — W19XXXA Unspecified fall, initial encounter: Secondary | ICD-10-CM

## 2017-04-30 DIAGNOSIS — Z79899 Other long term (current) drug therapy: Secondary | ICD-10-CM | POA: Diagnosis not present

## 2017-04-30 DIAGNOSIS — Y998 Other external cause status: Secondary | ICD-10-CM | POA: Diagnosis not present

## 2017-04-30 NOTE — ED Triage Notes (Signed)
[facility]

## 2017-04-30 NOTE — ED Provider Notes (Signed)
[facility]-EMERGENCY DEPT Provider Note   CSN: [id number] Arrival date & time: 04/30/17  1830     History   Chief Complaint Chief Complaint  Patient presents with  . Fall  . Head Injury    HPI Antonio Duran is a 76 y.o. male.  76 year old male presents from nursing home after witnessed fall.  Fall was mechanical.  There is been no reported history of illness.  He does have a history of dementia.  According to EMS and nursing records, patient did not have any loss of consciousness.  He has no visible signs of trauma.  The EMS was called and he was sent here for evaluation.      Past Medical History:  Diagnosis Date  . Alzheimer's dementia   . Enterococcus UTI     Patient Active Problem List   Diagnosis Date Noted  . Mixed dementia   . Palliative care by specialist   . Agitation   . Insomnia   . Enterococcus UTI 01/18/2016  . Confusion 01/18/2016  . Dementia with behavioral disturbance 01/18/2016    History reviewed. No pertinent surgical history.     Home Medications    Prior to Admission medications   Medication Sig Start Date End Date Taking? Authorizing Provider  acetaminophen (TYLENOL) 500 MG tablet Take 500 mg by mouth every 4 (four) hours as needed for mild pain, moderate pain, fever or headache.     [provider]  alum & mag hydroxide-simeth (MAALOX/MYLANTA) 200-200-20 MG/5ML suspension Take 30 mLs by mouth every 6 (six) hours as needed for indigestion or heartburn.    [provider]  divalproex (DEPAKOTE SPRINKLE) 125 MG capsule Take 500 mg by mouth 3 (three) times daily.     [provider]  estradiol (ESTRACE) 0.5 MG tablet Take 0.5 mg by mouth daily.    [provider]  guaifenesin (ROBITUSSIN) 100 MG/5ML syrup Take 200 mg by mouth every 6 (six) hours as needed for cough.    [provider]  loperamide (IMODIUM) 2 MG capsule Take 2 mg by mouth every 3 (three) hours as needed for  diarrhea or loose stools.     [provider]  magnesium hydroxide (MILK OF MAGNESIA) 400 MG/5ML suspension Take 30 mLs by mouth at bedtime as needed for mild constipation.     [provider]  memantine (NAMENDA XR) 28 MG CP24 24 hr capsule Take 28 mg by mouth daily.     [provider]  neomycin-bacitracin-polymyxin (NEOSPORIN) ointment Apply 1 application topically every 3 (three) hours as needed (for skin tears/abrasions). apply to eye     [provider]  PARoxetine (PAXIL) 40 MG tablet Take 40 mg by mouth at bedtime.    [provider]  QUEtiapine (SEROQUEL) 50 MG tablet Take 1 tablet (50 mg total) by mouth 2 (two) times daily. Patient not taking: Reported on 11/09/2016 01/23/16   Narda BondsNettey, Ralph A, MD  traZODone (DESYREL) 150 MG tablet Take 75 mg by mouth at bedtime.     [provider]    Family History Family History  Problem Relation Age of Onset  . Dementia Sister     Social History Social History   Tobacco Use  . Smoking status: Former Games developermoker  . Smokeless tobacco: Never Used  Substance Use Topics  . Alcohol use: No  . Drug use: No     Allergies   Zolpidem   Review of Systems Review of Systems  Unable to perform  ROS: Dementia     Physical Exam Updated Vital Signs BP (!) 116/93 (BP Location: Left Arm)   Pulse 76   Resp 12   SpO2 99%   Physical Exam  Constitutional: He appears well-developed and well-nourished.  Non-toxic appearance. No distress.  HENT:  Head: Normocephalic and atraumatic.  Eyes: Conjunctivae, EOM and lids are normal. Pupils are equal, round, and reactive to light.  Neck: Normal range of motion. Neck supple. No tracheal deviation present. No thyroid mass present.  Cardiovascular: Normal rate, regular rhythm and normal heart sounds. Exam reveals no gallop.  No murmur heard. Pulmonary/Chest: Effort normal and breath sounds normal. No stridor. No respiratory distress. He has no decreased  breath sounds. He has no wheezes. He has no rhonchi. He has no rales.  Abdominal: Soft. Normal appearance and bowel sounds are normal. He exhibits no distension. There is no tenderness. There is no rebound and no CVA tenderness.  Musculoskeletal: Normal range of motion. He exhibits no edema or tenderness.  Neurological: He is alert. He is disoriented. He displays no tremor. No cranial nerve deficit or sensory deficit. GCS eye subscore is 4. GCS verbal subscore is 5. GCS motor subscore is 6.  Skin: Skin is warm and dry. No abrasion and no rash noted.  Psychiatric: His affect is blunt.  Nursing note and vitals reviewed.    ED Treatments / Results  Labs (all labs ordered are listed, but only abnormal results are displayed) Labs Reviewed - No data to display  EKG  EKG Interpretation None       Radiology No results found.  Procedures Procedures (including critical care time)  Medications Ordered in ED Medications - No data to display   Initial Impression / Assessment and Plan / ED Course  I have reviewed the triage vital signs and the nursing notes.  Pertinent labs & imaging results that were available during my care of the patient were reviewed by me and considered in my medical decision making (see chart for details).     Patient has no visible signs of trauma.  He is currently ambulatory.  Appears to be at his baseline.  No indication for laboratory studies or imaging.  Final Clinical Impressions(s) / ED Diagnoses   Final diagnoses:  None    ED Discharge Orders    None       Lorre NickAllen, Lurlie Wigen, MD 04/30/17 Ernestina [id number]

## 2017-04-30 NOTE — ED Notes (Signed)
[facility]. No answer.
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
## 2017-07-26 ENCOUNTER — Other Ambulatory Visit: Payer: Self-pay

## 2017-07-26 ENCOUNTER — Emergency Department (HOSPITAL_COMMUNITY)
Admission: EM | Admit: 2017-07-26 | Discharge: 2017-07-30 | Disposition: A | Discharge status: Skilled Nursing Facility | Payer: Medicare HMO | Attending: Emergency Medicine | Admitting: Emergency Medicine

## 2017-07-26 ENCOUNTER — Encounter (HOSPITAL_COMMUNITY): Payer: Self-pay | Admitting: *Deleted

## 2017-07-26 DIAGNOSIS — Z79899 Other long term (current) drug therapy: Secondary | ICD-10-CM | POA: Diagnosis not present

## 2017-07-26 DIAGNOSIS — F0391 Unspecified dementia with behavioral disturbance: Secondary | ICD-10-CM | POA: Diagnosis not present

## 2017-07-26 DIAGNOSIS — Z87891 Personal history of nicotine dependence: Secondary | ICD-10-CM | POA: Insufficient documentation

## 2017-07-26 DIAGNOSIS — G309 Alzheimer's disease, unspecified: Secondary | ICD-10-CM | POA: Insufficient documentation

## 2017-07-26 DIAGNOSIS — F03918 Unspecified dementia, unspecified severity, with other behavioral disturbance: Secondary | ICD-10-CM | POA: Diagnosis present

## 2017-07-26 DIAGNOSIS — Z810 Family history of intellectual disabilities: Secondary | ICD-10-CM | POA: Diagnosis not present

## 2017-07-26 DIAGNOSIS — R4587 Impulsiveness: Secondary | ICD-10-CM | POA: Diagnosis not present

## 2017-07-26 DIAGNOSIS — Z008 Encounter for other general examination: Secondary | ICD-10-CM

## 2017-07-26 DIAGNOSIS — R451 Restlessness and agitation: Secondary | ICD-10-CM | POA: Diagnosis present

## 2017-07-26 LAB — ACETAMINOPHEN LEVEL: Acetaminophen (Tylenol), Serum: <10 ug/mL — ABNORMAL LOW (ref 10–30)

## 2017-07-26 LAB — COMPREHENSIVE METABOLIC PANEL
ALBUMIN: 3.1 g/dL — AB (ref 3.5–5.0)
ALT: 10 U/L — ABNORMAL LOW (ref 17–63)
AST: 14 U/L — ABNORMAL LOW (ref 15–41)
Alkaline Phosphatase: 56 U/L (ref 38–126)
Anion gap: 8 (ref 5–15)
BILIRUBIN TOTAL: 0.3 mg/dL (ref 0.3–1.2)
BUN: 29 mg/dL — ABNORMAL HIGH (ref 6–20)
CO2: 21 mmol/L — ABNORMAL LOW (ref 22–32)
Calcium: 8.7 mg/dL — ABNORMAL LOW (ref 8.9–10.3)
Chloride: 115 mmol/L — ABNORMAL HIGH (ref 101–111)
Creatinine, Ser: 1.45 mg/dL — ABNORMAL HIGH (ref 0.61–1.24)
GFR, EST AFRICAN AMERICAN: 52 mL/min — AB (ref >60)
GFR, EST NON AFRICAN AMERICAN: 45 mL/min — AB (ref >60)
Glucose, Bld: 125 mg/dL — ABNORMAL HIGH (ref 65–99)
POTASSIUM: 4.1 mmol/L (ref 3.5–5.1)
Sodium: 144 mmol/L (ref 135–145)
TOTAL PROTEIN: 6.2 g/dL — AB (ref 6.5–8.1)

## 2017-07-26 LAB — CBC WITH DIFFERENTIAL/PLATELET
BASOS PCT: 0 %
Basophils Absolute: 0 10*3/uL (ref 0.0–0.1)
Eosinophils Absolute: 0 10*3/uL (ref 0.0–0.7)
Eosinophils Relative: 1 %
HEMATOCRIT: 32.1 % — AB (ref 39.0–52.0)
Hemoglobin: 10.4 g/dL — ABNORMAL LOW (ref 13.0–17.0)
Lymphocytes Relative: 30 %
Lymphs Abs: 1.7 10*3/uL (ref 0.7–4.0)
MCH: 29.1 pg (ref 26.0–34.0)
MCHC: 32.4 g/dL (ref 30.0–36.0)
MCV: 89.9 fL (ref 78.0–100.0)
MONO ABS: 0.2 10*3/uL (ref 0.1–1.0)
MONOS PCT: 4 %
NEUTROS ABS: 3.7 10*3/uL (ref 1.7–7.7)
Neutrophils Relative %: 65 %
Platelets: 204 10*3/uL (ref 150–400)
RBC: 3.57 MIL/uL — ABNORMAL LOW (ref 4.22–5.81)
RDW: 13.9 % (ref 11.5–15.5)
WBC: 5.7 10*3/uL (ref 4.0–10.5)

## 2017-07-26 LAB — ETHANOL

## 2017-07-26 LAB — SALICYLATE LEVEL: Salicylate Lvl: <7 mg/dL (ref 2.8–30.0)

## 2017-07-26 MED ORDER — PAROXETINE HCL 20 MG PO TABS
40.0000 mg | ORAL_TABLET | Freq: Every day | ORAL | Status: DC
Start: 2017-07-26 — End: 2017-07-27
  Filled 2017-07-26: qty 2

## 2017-07-26 MED ORDER — HALOPERIDOL LACTATE 5 MG/ML IJ SOLN
2.0000 mg | Freq: Once | INTRAMUSCULAR | Status: AC
  Administered 2017-07-26: 2 mg via INTRAMUSCULAR
  Filled 2017-07-26: qty 1

## 2017-07-26 MED ORDER — TRAZODONE HCL 50 MG PO TABS
75.0000 mg | ORAL_TABLET | Freq: Every day | ORAL | Status: DC
Start: 2017-07-26 — End: 2017-07-27

## 2017-07-26 MED ORDER — QUETIAPINE FUMARATE 50 MG PO TABS
50.0000 mg | ORAL_TABLET | Freq: Two times a day (BID) | ORAL | Status: DC
Start: 2017-07-26 — End: 2017-07-27
  Administered 2017-07-27: 50 mg via ORAL
  Filled 2017-07-26: qty 1

## 2017-07-26 MED ORDER — ACETAMINOPHEN 500 MG PO TABS: 500.0000 mg | ORAL_TABLET | ORAL | Status: DC | PRN

## 2017-07-26 MED ORDER — DIVALPROEX SODIUM 125 MG PO CSDR
250.0000 mg | DELAYED_RELEASE_CAPSULE | Freq: Three times a day (TID) | ORAL | Status: DC
  Administered 2017-07-27 – 2017-07-30 (×11): 250 mg via ORAL
  Filled 2017-07-26 (×13): qty 2

## 2017-07-26 MED ORDER — MEMANTINE HCL ER 28 MG PO CP24
28.0000 mg | ORAL_CAPSULE | Freq: Every day | ORAL | Status: DC
Start: 2017-07-27 — End: 2017-07-31
  Administered 2017-07-27 – 2017-07-30 (×4): 28 mg via ORAL
  Filled 2017-07-26 (×4): qty 1

## 2017-07-26 MED ORDER — DIVALPROEX SODIUM 125 MG PO CSDR: 500.0000 mg | DELAYED_RELEASE_CAPSULE | Freq: Three times a day (TID) | ORAL | Status: DC

## 2017-07-26 MED ORDER — LORAZEPAM 2 MG/ML IJ SOLN
1.0000 mg | Freq: Once | INTRAMUSCULAR | Status: AC
  Administered 2017-07-26: 1 mg via INTRAMUSCULAR
  Filled 2017-07-26: qty 1

## 2017-07-26 MED ORDER — LORAZEPAM 1 MG PO TABS
1.0000 mg | ORAL_TABLET | ORAL | Status: DC | PRN
  Administered 2017-07-26 – 2017-07-29 (×4): 1 mg via ORAL
  Filled 2017-07-26 (×4): qty 1
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
## 2017-07-26 NOTE — ED Notes (Signed)
Pt provided with yellow slip resistant socks.

## 2017-07-26 NOTE — ED Notes (Signed)
Pt awake and out of bed. Pt has advanced dementia. Pt redirected back to room. Attempted to Reorient pt. Pt back in bed at this time. Pt provided with warm blankets. Pt laying in bed at this time.

## 2017-07-26 NOTE — ED Notes (Signed)
Bed: [facility]27 Expected date:  Expected time:  Means of arrival:  Comments: 

## 2017-07-26 NOTE — ED Notes (Signed)
Bed: [facility] Expected date:  Expected time:  Means of arrival:  Comments: 

## 2017-07-26 NOTE — BH Assessment (Signed)
[facility] Assessment Progress Note This writer informed Yao MD in reference to patient's current admission status. Silverio LayYao MD informed this Clinical research associatewriter that patient will remain voluntary until any behaviors are noted that warrant IVC. Status pending.

## 2017-07-26 NOTE — BH Assessment (Signed)
[facility] Assessment Progress Note  Case was staffed with Arville CareParks FNP who recommended patient be monitored and observed for safety. Psychiatry to see patient in the a.m.
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
## 2017-07-26 NOTE — ED Notes (Signed)
[facility] wants to be contacted when pt is seen by TTS.  Albin FellingCarla [phone]

## 2017-07-26 NOTE — ED Notes (Signed)
Patient continually attempting to get out of bed and is unsafe due to unsteadiness. Dr. Silverio LayYao aware; new orders obtained. Pt placed in bilateral soft wrist and soft bilateral ankle restraints for safety. Sitter remains at bedside for safety.

## 2017-07-26 NOTE — ED Notes (Signed)
Pt remains restless and labile at this time. Pt with no signs of distress noted; respirations regular and unlabored, skin warm and dry. Sitter at bedside for safety.

## 2017-07-26 NOTE — ED Notes (Signed)
Pt back out of bed. Pt redirected back to room. Safety sitter at bedside.
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
## 2017-07-27 DIAGNOSIS — F0391 Unspecified dementia with behavioral disturbance: Secondary | ICD-10-CM

## 2017-07-27 DIAGNOSIS — R4587 Impulsiveness: Secondary | ICD-10-CM | POA: Diagnosis not present

## 2017-07-27 MED ORDER — ASENAPINE MALEATE 5 MG SL SUBL
5.0000 mg | SUBLINGUAL_TABLET | Freq: Two times a day (BID) | SUBLINGUAL | Status: DC | PRN
  Administered 2017-07-27 – 2017-07-29 (×3): 5 mg via SUBLINGUAL
  Filled 2017-07-27 (×3): qty 1

## 2017-07-27 MED ORDER — QUETIAPINE FUMARATE 100 MG PO TABS
100.0000 mg | ORAL_TABLET | Freq: Every day | ORAL | Status: DC
  Administered 2017-07-28 – 2017-07-29 (×2): 100 mg via ORAL
  Filled 2017-07-27 (×2): qty 1

## 2017-07-27 MED ORDER — PAROXETINE HCL 20 MG PO TABS
20.0000 mg | ORAL_TABLET | Freq: Every day | ORAL | Status: DC
  Administered 2017-07-27 – 2017-07-29 (×3): 20 mg via ORAL
  Filled 2017-07-27 (×4): qty 1
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
## 2017-07-27 NOTE — ED Notes (Signed)
Patient remains confused, restless and unable to follow directions. Pt continuously attempting to get out of bed, despite redirection from staff. Pt unsteady. Non-violent restraints remain in place for safety. Pt given a full bed bath and linen change done for comfort. Pt with no signs of distress noted at this time. Respirations regular and unlabored; skin warm and dry.

## 2017-07-28 ENCOUNTER — Emergency Department (HOSPITAL_COMMUNITY): Payer: Medicare HMO

## 2017-07-28 DIAGNOSIS — R413 Other amnesia: Secondary | ICD-10-CM

## 2017-07-28 LAB — URINALYSIS, ROUTINE W REFLEX MICROSCOPIC
BILIRUBIN URINE: NEGATIVE
Glucose, UA: NEGATIVE mg/dL
Ketones, ur: NEGATIVE mg/dL
LEUKOCYTES UA: NEGATIVE
NITRITE: NEGATIVE
Protein, ur: NEGATIVE mg/dL
SPECIFIC GRAVITY, URINE: 1.02 (ref 1.005–1.030)
pH: 5 (ref 5.0–8.0)

## 2017-07-28 LAB — RAPID URINE DRUG SCREEN, HOSP PERFORMED
AMPHETAMINES: NOT DETECTED
BARBITURATES: NOT DETECTED
Benzodiazepines: POSITIVE — AB
Cocaine: NOT DETECTED
OPIATES: NOT DETECTED
TETRAHYDROCANNABINOL: NOT DETECTED

## 2017-07-28 NOTE — Consult Note (Signed)
[facility] Psych ED Progress Note  07/28/2017 12:06 PM Antonio Duran  MRN:  [id number] Subjective:   Mr. Antonio Duran reports that he is doing okay today. He reports, "We have to get started to get things going." He is only oriented to person. He is able to report that he lives in [place] although he believes that he lives at home. He reports coming to the hospital because he was sick.   Principal Problem: Dementia with behavioral disturbance Diagnosis:   Patient Active Problem List   Diagnosis Date Noted  . 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 Palliative care by specialist [Z51.5]   . Agitation [R45.1]   . Insomnia [G47.00]   . Enterococcus UTI [N39.0, B95.2] 01/18/2016  . Confusion [R41.0] 01/18/2016  . Dementia with behavioral disturbance [F03.91] 01/18/2016   Total Time spent with patient: 15 minutes  Past Psychiatric History: Dementia  Past Medical History:  Past Medical History:  Diagnosis Date  . Alzheimer's dementia   . Enterococcus UTI    History reviewed. No pertinent surgical history. Family History:  Family History  Problem Relation Age of Onset  . Dementia Sister    Family Psychiatric  History: Unknown  Social History:  Social History   Substance and Sexual Activity  Alcohol Use No     Social History   Substance and Sexual Activity  Drug Use No    Social History   Socioeconomic History  . Marital status: Married    Spouse name: None  . Number of children: None  . Years of education: None  . Highest education level: None  Social Needs  . Financial resource strain: None  . Food insecurity - worry: None  . Food insecurity - inability: None  . Transportation needs - medical: None  . Transportation needs - non-medical: None  Occupational History  . None  Tobacco Use  . Smoking status: Former Research scientist (life sciences)  . Smokeless tobacco: Never Used  Substance and Sexual Activity  . Alcohol use: No  . Drug use: No  . Sexual activity: None  Other Topics Concern   . None  Social History Narrative  . None    Sleep: Good  Appetite:  Good  Current Medications: Current Facility-Administered Medications  Medication Dose Route Frequency Provider Last Rate Last Dose  . acetaminophen (TYLENOL) tablet 500 mg  500 mg Oral Q4H PRN Drenda Freeze, MD      . asenapine (SAPHRIS) sublingual tablet 5 mg  5 mg Sublingual Q12H PRN Corena Pilgrim, MD   5 mg at 07/27/17 2120  . divalproex (DEPAKOTE SPRINKLE) capsule 250 mg  250 mg Oral TID Drenda Freeze, MD   250 mg at 07/28/17 1020  . LORazepam (ATIVAN) tablet 1 mg  1 mg Oral Q4H PRN Drenda Freeze, MD   1 mg at 07/27/17 2120  . memantine (NAMENDA XR) 24 hr capsule 28 mg  28 mg Oral Daily Drenda Freeze, MD   28 mg at 07/28/17 1020  . PARoxetine (PAXIL) tablet 20 mg  20 mg Oral QHS Akintayo, Mojeed, MD   20 mg at 07/27/17 2121  . QUEtiapine (SEROQUEL) tablet 100 mg  100 mg Oral QHS Corena Pilgrim, MD       Current Outpatient Medications  Medication Sig Dispense Refill  . divalproex (DEPAKOTE SPRINKLE) 125 MG capsule Take 250 mg by mouth 3 (three) times daily.     Marland Kitchen estradiol (ESTRACE) 0.5 MG tablet Take 0.5 mg by mouth daily.    Marland Kitchen LORazepam (  ATIVAN) 0.5 MG tablet Take 0.5 mg by mouth every 6 (six) hours as needed for anxiety.    . memantine (NAMENDA XR) 28 MG CP24 24 hr capsule Take 28 mg by mouth daily.     Marland Kitchen PARoxetine (PAXIL) 40 MG tablet Take 40 mg by mouth at bedtime.    Marland Kitchen QUEtiapine (SEROQUEL) 50 MG tablet Take 1 tablet (50 mg total) by mouth 2 (two) times daily. 60 tablet 0  . traZODone (DESYREL) 150 MG tablet Take 75 mg by mouth at bedtime.     Marland Kitchen acetaminophen (TYLENOL) 500 MG tablet Take 500 mg by mouth every 4 (four) hours as needed for mild pain, moderate pain, fever or headache.       Lab Results:  Results for orders placed or performed during the hospital encounter of 07/26/17 (from the past 48 hour(s))  CBC with Differential/Platelet     Status: Abnormal   Collection Time:  07/26/17  5:19 PM  Result Value Ref Range   WBC 5.7 4.0 - 10.5 K/uL   RBC 3.57 (L) 4.22 - 5.81 MIL/uL   Hemoglobin 10.4 (L) 13.0 - 17.0 g/dL   HCT 32.1 (L) 39.0 - 52.0 %   MCV 89.9 78.0 - 100.0 fL   MCH 29.1 26.0 - 34.0 pg   MCHC 32.4 30.0 - 36.0 g/dL   RDW 13.9 11.5 - 15.5 %   Platelets 204 150 - 400 K/uL   Neutrophils Relative % 65 %   Neutro Abs 3.7 1.7 - 7.7 K/uL   Lymphocytes Relative 30 %   Lymphs Abs 1.7 0.7 - 4.0 K/uL   Monocytes Relative 4 %   Monocytes Absolute 0.2 0.1 - 1.0 K/uL   Eosinophils Relative 1 %   Eosinophils Absolute 0.0 0.0 - 0.7 K/uL   Basophils Relative 0 %   Basophils Absolute 0.0 0.0 - 0.1 K/uL    Comment: Performed at [facility], [place] [address], [place], [place] [address]  Comprehensive metabolic panel     Status: Abnormal   Collection Time: 07/26/17  5:19 PM  Result Value Ref Range   Sodium 144 135 - 145 mmol/L   Potassium 4.1 3.5 - 5.1 mmol/L   Chloride 115 (H) 101 - 111 mmol/L   CO2 21 (L) 22 - 32 mmol/L   Glucose, Bld 125 (H) 65 - 99 mg/dL   BUN 29 (H) 6 - 20 mg/dL   Creatinine, Ser 1.45 (H) 0.61 - 1.24 mg/dL   Calcium 8.7 (L) 8.9 - 10.3 mg/dL   Total Protein 6.2 (L) 6.5 - 8.1 g/dL   Albumin 3.1 (L) 3.5 - 5.0 g/dL   AST 14 (L) 15 - 41 U/L   ALT 10 (L) 17 - 63 U/L   Alkaline Phosphatase 56 38 - 126 U/L   Total Bilirubin 0.3 0.3 - 1.2 mg/dL   GFR calc non Af Amer 45 (L) >60 mL/min   GFR calc Af Amer 52 (L) >60 mL/min    Comment: (NOTE) The eGFR has been calculated using the CKD EPI equation. This calculation has not been validated in all clinical situations. eGFR's persistently <60 mL/min signify possible Chronic Kidney Disease.    Anion gap 8 5 - 15    Comment: Performed at [facility], [place] [address] [place], [place] [address]  Ethanol     Status: None   Collection Time: 07/26/17  5:20 PM  Result Value Ref Range   Alcohol, Ethyl (B) <10 <10 mg/dL  Comment:        LOWEST DETECTABLE LIMIT  FOR SERUM ALCOHOL IS 10 mg/dL FOR MEDICAL PURPOSES ONLY Performed at [place] [address] [place], [place] [address]   Salicylate level     Status: None   Collection Time: 07/26/17  5:20 PM  Result Value Ref Range   Salicylate Lvl <5.4 2.8 - 30.0 mg/dL    Comment: Performed at [facility], [place] [address] [place], [place] [address]  Acetaminophen level     Status: Abnormal   Collection Time: 07/26/17  5:20 PM  Result Value Ref Range   Acetaminophen (Tylenol), Serum <10 (L) 10 - 30 ug/mL    Comment:        THERAPEUTIC CONCENTRATIONS VARY SIGNIFICANTLY. A RANGE OF 10-30 ug/mL MAY BE AN EFFECTIVE CONCENTRATION FOR MANY PATIENTS. HOWEVER, SOME ARE BEST TREATED AT CONCENTRATIONS OUTSIDE THIS RANGE. ACETAMINOPHEN CONCENTRATIONS >150 ug/mL AT 4 HOURS AFTER INGESTION AND >50 ug/mL AT 12 HOURS AFTER INGESTION ARE OFTEN ASSOCIATED WITH TOXIC REACTIONS. Performed at [facility], [place] [address] [place],  [place] [address]   Urinalysis, Routine w reflex microscopic     Status: Abnormal   Collection Time: 07/28/17  7:43 AM  Result Value Ref Range   Color, Urine YELLOW YELLOW   APPearance CLEAR CLEAR   Specific Gravity, Urine 1.020 1.005 - 1.030   pH 5.0 5.0 - 8.0   Glucose, UA NEGATIVE NEGATIVE mg/dL   Hgb urine dipstick LARGE (A) NEGATIVE   Bilirubin Urine NEGATIVE NEGATIVE   Ketones, ur NEGATIVE NEGATIVE mg/dL   Protein, ur NEGATIVE NEGATIVE mg/dL   Nitrite NEGATIVE NEGATIVE   Leukocytes, UA NEGATIVE NEGATIVE   RBC / HPF TOO NUMEROUS TO COUNT 0 - 5 RBC/hpf   WBC, UA 0-5 0 - 5 WBC/hpf   Bacteria, UA RARE (A) NONE SEEN   Squamous Epithelial / LPF 0-5 (A) NONE SEEN   Mucus PRESENT     Comment: Performed at [facility], [place] [address] [place], [place] [address]  Rapid urine drug screen (hospital performed)     Status: Abnormal   Collection Time: 07/28/17  7:44 AM  Result Value Ref Range    Opiates NONE DETECTED NONE DETECTED   Cocaine NONE DETECTED NONE DETECTED   Benzodiazepines POSITIVE (A) NONE DETECTED   Amphetamines NONE DETECTED NONE DETECTED   Tetrahydrocannabinol NONE DETECTED NONE DETECTED   Barbiturates NONE DETECTED NONE DETECTED    Comment: (NOTE) DRUG SCREEN FOR MEDICAL PURPOSES ONLY.  IF CONFIRMATION IS NEEDED FOR ANY PURPOSE, NOTIFY LAB WITHIN 5 DAYS. LOWEST DETECTABLE LIMITS FOR URINE DRUG SCREEN Drug Class                     Cutoff (ng/mL) Amphetamine and metabolites    1000 Barbiturate and metabolites    200 Benzodiazepine                 944 Tricyclics and metabolites     300 Opiates and metabolites        300 Cocaine and metabolites        300 THC                            50 Performed at [facility], [place] [address], [place],  [address]     Blood Alcohol level:  Lab Results  Component Value Date   ETH <10 07/26/2017  Musculoskeletal: Strength & Muscle Tone: decreased due to physical deconditioning.  Gait & Station: UTA since patient was lying in bed. Patient leans: N/A  Psychiatric Specialty Exam: Physical Exam  Nursing note and vitals reviewed. Constitutional: He appears well-developed and well-nourished.  HENT:  Head: Normocephalic and atraumatic.  Neck: Normal range of motion.  Musculoskeletal: Normal range of motion.  Neurological: He is alert.  Oriented to person.   Skin: No rash noted.  Psychiatric: He has a normal mood and affect. His speech is normal and behavior is normal. Thought content normal. Cognition and memory are impaired. He expresses impulsivity.    Review of Systems  Psychiatric/Behavioral: Positive for memory loss.  All other systems reviewed and are negative.   Blood pressure (!) 144/92, pulse (!) 57, temperature 98.3 F (36.8 C), temperature source Oral, resp. rate 16, SpO2 96 %.There is no height or weight on file to calculate BMI.  General Appearance: Fairly Groomed,  tall, elderly male, wearing paper hospital scrubs and lying in bed. NAD.   Eye Contact:  Good  Speech:  Clear and Coherent and Normal Rate  Volume:  Normal  Mood:  "Okay"  Affect:  Appropriate  Thought Process:  Descriptions of Associations: Tangential  Orientation:  Other:  To person.  Thought Content:  Illogical  Suicidal Thoughts:  No  Homicidal Thoughts:  No  Memory:  Immediate;   Poor Recent;   Fair Remote;   Fair  Judgement:  Poor  Insight:  Lacking  Psychomotor Activity:  Normal  Concentration:  Concentration: Fair and Attention Span: Fair  Recall:  Poor  Fund of Knowledge:  Poor  Language:  Fair  Akathisia:  No  Handed:  Right  AIMS (if indicated):   N/A  Assets:  Communication Skills  ADL's:  Impaired  Cognition:  Impaired due to dementia.   Sleep:   N/A   Assessment:  Naseem Varden is a 77 y.o. male who was admitted with worsening aggression from [facility]. He continues to warrant inpatient psychiatric hospitalization for stabilization and treatment.    Treatment Plan Summary: Daily contact with patient to assess and evaluate symptoms and progress in treatment and Medication management   Faythe Dingwall, DO 07/28/2017, 12:06 PM

## 2017-07-28 NOTE — ED Notes (Signed)
Urine specimen obtained via catheterization. Minute amount of blood noted to tip of penis due to catheter tip insertion. No noted continued bleeding from urethra.

## 2017-07-28 NOTE — BH Assessment (Signed)
[facility]  Per Juanetta BeetsJacqueline Norman, DO, this pt requires psychiatric hospitalization at this time.  Pt presents under IVC initiated by Mojeed Akintayo.  This Clinical research associatewriter has been in Corporate investment bankercommunication Melissa [facility] during the course of the day.  She reports that she has received referral, but requests supplemental information, which I have provided.  Decision is pending as this writing.   Doylene Canninghomas Morley Gaumer, [place] Behavioral Health Coordinator [phone]

## 2017-07-29 NOTE — ED Notes (Addendum)
Multiple times this hour, pt has woken up, moved in bed, and gone back to sleep. These episodes just last for a few seconds. Respirations even and unlabored following events. No redness around restraints.

## 2017-07-29 NOTE — ED Notes (Addendum)
Patient trying to get out of bed. Stating " I have to go home". Kicking feet over rail. Taking off diaper. Night meds given early.

## 2017-07-29 NOTE — BH Assessment (Signed)
[facility]  Per Juanetta BeetsJacqueline Norman, DO, this pt continues to require psychiatric hospitalization at this time.  The following facilities have been contacted to seek placement for this pt, with results as noted:  Beds available, information sent, decision pending:  [facility]   At capacity:  [facility] (has retained the referral for future consideration)   Doylene Canninghomas Jolicia Delira, [place] Behavioral Health Coordinator [phone]

## 2017-07-29 NOTE — ED Notes (Signed)
Pt rested this hour with even unlabored respirations. Pt has woken up multiple times this hour, spoke a few words, and fell back asleep.

## 2017-07-29 NOTE — ED Notes (Signed)
Pt was woken up to change brief. Pt was cooperative and calm at this time. Restraints untied from bed.

## 2017-07-29 NOTE — ED Notes (Signed)
Patient trying to get out of bed. PRN medications ordered will give.
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
## 2017-07-30 DIAGNOSIS — Z87891 Personal history of nicotine dependence: Secondary | ICD-10-CM

## 2017-07-30 DIAGNOSIS — F0391 Unspecified dementia with behavioral disturbance: Secondary | ICD-10-CM | POA: Diagnosis not present

## 2017-07-30 DIAGNOSIS — R4587 Impulsiveness: Secondary | ICD-10-CM | POA: Diagnosis not present

## 2017-07-30 DIAGNOSIS — Z810 Family history of intellectual disabilities: Secondary | ICD-10-CM

## 2017-07-30 MED ORDER — QUETIAPINE FUMARATE 100 MG PO TABS: 100.0000 mg | ORAL_TABLET | Freq: Every day | ORAL | 0 refills | Status: AC

## 2017-07-30 MED ORDER — LORAZEPAM 1 MG PO TABS: 1.0000 mg | ORAL_TABLET | ORAL | 0 refills | Status: AC | PRN

## 2017-07-30 MED ORDER — PAROXETINE HCL 20 MG PO TABS: 20.0000 mg | ORAL_TABLET | Freq: Every day | ORAL | 0 refills | Status: AC

## 2017-07-30 MED ORDER — ASENAPINE MALEATE 5 MG SL SUBL: 5.0000 mg | SUBLINGUAL_TABLET | Freq: Two times a day (BID) | SUBLINGUAL | 0 refills | Status: DC | PRN

## 2017-07-30 MED ORDER — OLANZAPINE 5 MG PO TABS: 5.0000 mg | ORAL_TABLET | Freq: Three times a day (TID) | ORAL | 1 refills | Status: AC | PRN

## 2017-07-30 NOTE — BH Assessment (Signed)
[facility] Assessment Progress Note  Per Juanetta BeetsJacqueline Norman, DO, this pt [facility] at this [facility].  Pt presents under IVC initiated by Thedore MinsMojeed Akintayo, MD, which Dr Sharma CovertNorman has rescinded.  Pt is psychiatrically cleared.  He does not require any behavioral health referrals.  At 10:29 this Clinical research associatewriter spoke to [place], [place], who agrees to address pt's psychosocial needs.  Pt's nurse has been notified.  Doylene Canninghomas Jaydis Duchene, MA Triage Specialist [phone]
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
## 2017-07-30 NOTE — Progress Notes (Signed)
CSW [facility] and was informed that she would speak with Nurse Liaison about pt coming back to the facility. Albin FellingCarla to call CSW back once she's speaks with RN Liaison.    Claude MangesKierra S. Raegyn Renda, MSW, LCSW-A Emergency Department Clinical Social Worker [phone]

## 2017-07-30 NOTE — ED Provider Notes (Signed)
Patient evaluated by psychiatry and feel that he is safe to return to nursing home however initial medication they recommended was too expensive and his insurance does not cover it.  He was given a prescription for Zyprexa.   Gwyneth SproutPlunkett, Leiann Sporer, MD 07/30/17 [id number]

## 2017-07-30 NOTE — [facility] Suicide Risk Assessment (Signed)
Suicide Risk Assessment  Discharge Assessment   [facility]   Principal Problem: Dementia with behavioral disturbance Discharge Diagnoses:  Patient Active Problem List   Diagnosis Date Noted  . Dementia with behavioral disturbance [F03.91] 01/18/2016    Priority: High  . 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 Palliative care by specialist [Z51.5]   . Agitation [R45.1]   . Insomnia [G47.00]   . Enterococcus UTI [N39.0, B95.2] 01/18/2016  . Confusion [R41.0] 01/18/2016    Total Time spent with patient: 30 minutes  Musculoskeletal: Strength & Muscle Tone: within normal limits Gait & Station: unsteady Patient leans: N/A  Psychiatric Specialty Exam: Physical Exam  Constitutional: He appears well-developed and well-nourished.  HENT:  Head: Normocephalic.  Neck: Normal range of motion.  Respiratory: Effort normal.  Musculoskeletal: Normal range of motion.  Neurological: He is alert.  Psychiatric: He has a normal mood and affect. His speech is normal and behavior is normal. Thought content normal. Cognition and memory are impaired. He expresses impulsivity.    Review of Systems  Psychiatric/Behavioral: Positive for memory loss.  All other systems reviewed and are negative.   Blood pressure 133/90, pulse 78, temperature 97.6 F (36.4 C), temperature source Oral, resp. rate 16, SpO2 100 %.There is no height or weight on file to calculate BMI.  General Appearance: Casual  Eye Contact:  Fair  Speech:  Normal Rate  Volume:  Normal  Mood:  Euthymic  Affect:  Congruent  Thought Process:  Coherent and Descriptions of Associations: Intact  Orientation:  Other:  person  Thought Content:  WDL  Suicidal Thoughts:  No  Homicidal Thoughts:  No  Memory:  Immediate;   Fair Recent;   Poor Remote;   Poor  Judgement:  Impaired  Insight:  Lacking  Psychomotor Activity:  Normal  Concentration:  Concentration: Fair and Attention Span: Fair  Recall:  Poor   Fund of Knowledge:  Fair  Language:  Fair  Akathisia:  No  Handed:  Right  AIMS (if indicated):     Assets:  Leisure Time Resilience Social Support  ADL's:  Impaired  Cognition:  Impaired,  Moderate  Sleep:       Mental Status Per Nursing Assessment::   On Admission:   agitation  Demographic Factors:  Male and Age 77 or older  Loss Factors: NA  Historical Factors: Impulsivity  Risk Reduction Factors:   Sense of responsibility to family, Living with another person, especially a relative and Positive social support  Continued Clinical Symptoms:  Memory issues  Cognitive Features That Contribute To Risk:  None    Suicide Risk:  Minimal: No identifiable suicidal ideation.  Patients presenting with no risk factors but with morbid ruminations; may be classified as minimal risk based on the severity of the depressive symptoms    Plan Of Care/Follow-up recommendations:  Activity:  as tolerated Diet:  heart healthy diet  Victoriya Pol, NP 07/30/2017, 1:19 PM

## 2017-07-30 NOTE — ED Notes (Addendum)
[facility] called, they will not take the patient until all of the patients discharge medications are filled. They state they are worried the patient will act out and they will not have the pt's medication. They say they cannot fill the patient's asenapine because his insurance is not covering the medication and it is too expensive.

## 2017-07-30 NOTE — Progress Notes (Signed)
CSW received a from [place] [facility] who requested info on what was needed.  CSW consulted ED director who had resolved issue with a needed medication change and CSW called Antonio Duran back and updated her.  Antonio Duran was appreciative and thanked the CSW.  Please reconsult if future social work needs arise.  CSW signing off, as social work intervention is no longer needed.  Antonio PeaJonathan F. Jaquail Mclees, LCSW, LCAS, CSI Clinical Social Worker Ph: [phone]

## 2017-07-30 NOTE — ED Notes (Signed)
Dialed [phone],spoke with Marisue IvanLiz & informed pt is currently being transported back to facility.

## 2017-07-30 NOTE — ED Notes (Signed)
Pt. Soiled brief. New brief put on.

## 2017-07-30 NOTE — Progress Notes (Addendum)
[facility] at [phone]. [facility] informed CSW that a nurse is on her way to the hospital to assess the pt to determine if [facility].   [facility] CSW will provide handoff to [facility]W to continue to follow for discharge needs.  Update 4:40 CSW received phone call from [facility]. [facility].   Montine CircleKelsy Penn Duran, Silverio LayLCSWA [facility]  [phone]

## 2017-07-30 NOTE — Progress Notes (Addendum)
2:28pm- CSW spoke with Antonio Duran who also works with [facility] to see if thy can take pt back today. Antonio Duran reached out to Antonio Duran as well to see about admitting pt back into the facility. CSW still waiting for response from Antonio Duran on taking pt back.   CSW was asked to send FL2 over to Antonio Duran to see if they can take pt back. CSW has faxed over FL 2 as well as spoke with Antonio Duran in [facility] to see what new medication were added to pt's list. Per Antonio Duran they added Antonio Duran 5mg  BID to pt's list at this time. At this time CSW is awaiting response on [facility] on [facility]. Antonio Duran expressed that she has to speak with RN liaison first.     Antonio Duran, MSW, LCSW-A Emergency Department Clinical Social Worker [phone]

## 2017-07-30 NOTE — Progress Notes (Signed)
CSW received call from Tom with [facility] and was informed that pt is in of further services. CSW called Tom back to get more details on pt's needs as CSW isn't sure what exact needs are. CSW will continue to follow at this time for further needs.     Claude MangesKierra S. Yurianna Tusing, MSW, LCSW-A Emergency Department Clinical Social Worker [phone]

## 2017-07-30 NOTE — NC FL2 (Addendum)
[place] MEDICAID FL2 LEVEL OF CARE SCREENING TOOL     IDENTIFICATION  Patient Name: Antonio Duran Cuffe Birthdate: 05/25/1940 Sex: male Admission Date (Current Location): 07/26/2017  [facility] [place]   Facility and Address:  [facility],  [address], [place] [id number]      Provider Number: [id number]  Attending Physician Name and Address:  Default, Provider, MD  Relative Name and Phone Number:       Current Level of Care: Hospital Recommended Level of Care: Memory Care Prior Approval Number:    Date Approved/Denied:   PASRR Number:   [id number] [id number]   Discharge Plan: Other (Comment)(memory care)    Current Diagnoses: Patient Active Problem List   Diagnosis Date Noted  . Mixed dementia   . Palliative care by specialist   . Agitation   . Insomnia   . Enterococcus UTI 01/18/2016  . Confusion 01/18/2016  . Dementia with behavioral disturbance 01/18/2016    Orientation RESPIRATION BLADDER Height & Weight     (pt has dementia. )  Normal   Weight:   Height:     BEHAVIORAL SYMPTOMS/MOOD NEUROLOGICAL BOWEL NUTRITION STATUS        Diet(please see discharge summary. )  AMBULATORY STATUS COMMUNICATION OF NEEDS Skin   Limited Assist Verbally Normal                       Personal Care Assistance Level of Assistance  Bathing, Feeding, Dressing Bathing Assistance: Maximum assistance Feeding assistance: Limited assistance Dressing Assistance: Maximum assistance     Functional Limitations Info  Sight, Hearing, Speech Sight Info: Adequate Hearing Info: Adequate Speech Info: Adequate    SPECIAL CARE FACTORS FREQUENCY  PT (By licensed PT), OT (By licensed OT)     PT Frequency: 5 times a week  OT Frequency: 5 times a week             Contractures Contractures Info: Not present    Additional Factors Info  Code Status, Allergies Code Status Info: Full  Allergies Info: Zolpidem           Current Medications  (07/30/2017):  This is the current hospital active medication list Current Facility-Administered Medications  Medication Dose Route Frequency Provider Last Rate Last Dose  . acetaminophen (TYLENOL) tablet 500 mg  500 mg Oral Q4H PRN Charlynne PanderYao, David Hsienta, MD      . asenapine (SAPHRIS) sublingual tablet 5 mg  5 mg Sublingual Q12H PRN Thedore MinsAkintayo, Mojeed, MD   5 mg at 07/29/17 1923  . divalproex (DEPAKOTE SPRINKLE) capsule 250 mg  250 mg Oral TID Charlynne PanderYao, David Hsienta, MD   250 mg at 07/30/17 [id number]  . LORazepam (ATIVAN) tablet 1 mg  1 mg Oral Q4H PRN Charlynne PanderYao, David Hsienta, MD   1 mg at 07/29/17 1922  . memantine (NAMENDA XR) 24 hr capsule 28 mg  28 mg Oral Daily Charlynne PanderYao, David Hsienta, MD   28 mg at 07/30/17 0954  . PARoxetine (PAXIL) tablet 20 mg  20 mg Oral QHS Akintayo, Mojeed, MD   20 mg at 07/29/17 2110  . QUEtiapine (SEROQUEL) tablet 100 mg  100 mg Oral QHS Akintayo, Mojeed, MD   100 mg at 07/29/17 2110   Current Outpatient Medications  Medication Sig Dispense Refill  . divalproex (DEPAKOTE SPRINKLE) 125 MG capsule Take 250 mg by mouth 3 (three) times daily.     Marland Kitchen. estradiol (ESTRACE) 0.5 MG tablet Take 0.5 mg by mouth daily.    .Marland Kitchen  LORazepam (ATIVAN) 0.5 MG tablet Take 0.5 mg by mouth every 6 (six) hours as needed for anxiety.    . memantine (NAMENDA XR) 28 MG CP24 24 hr capsule Take 28 mg by mouth daily.     Marland Kitchen PARoxetine (PAXIL) 40 MG tablet Take 40 mg by mouth at bedtime.    Marland Kitchen QUEtiapine (SEROQUEL) 50 MG tablet Take 1 tablet (50 mg total) by mouth 2 (two) times daily. 60 tablet 0  . traZODone (DESYREL) 150 MG tablet Take 75 mg by mouth at bedtime.     Marland Kitchen acetaminophen (TYLENOL) 500 MG tablet Take 500 mg by mouth every 4 (four) hours as needed for mild pain, moderate pain, fever or headache.        Discharge Medications: Please see discharge summary for a list of discharge medications.  Relevant Imaging Results:  Relevant Lab Results:   Additional Information [id number]  Robb Matar,  LCSWA

## 2017-07-30 NOTE — ED Notes (Signed)
[facility] pts prescribed medication ( asenapine) costs 1200.00 and his ins wont cover it.  [facility] not [facility] to accept patient without the med.  I spoke to provider Nanine MeansJamison Lord who gave an order for Zyprexa 5 mg po prn q 8 hrs for aggitation in place of the Asenapine.  Dr Anitra LauthPlunkett wrote the Zyprexa prescription. [facility].

## 2017-08-09 ENCOUNTER — Inpatient Hospital Stay (HOSPITAL_COMMUNITY)
Admission: EM | Admit: 2017-08-09 | Discharge: 2017-08-16 | DRG: 871 | Disposition: A | Discharge status: Home / Self Care | Payer: Medicare HMO | Attending: Internal Medicine | Admitting: Internal Medicine

## 2017-08-09 ENCOUNTER — Inpatient Hospital Stay (HOSPITAL_COMMUNITY): Payer: Medicare HMO

## 2017-08-09 ENCOUNTER — Emergency Department (HOSPITAL_COMMUNITY): Payer: Medicare HMO

## 2017-08-09 ENCOUNTER — Encounter (HOSPITAL_COMMUNITY): Payer: Self-pay | Admitting: Family Medicine

## 2017-08-09 DIAGNOSIS — D631 Anemia in chronic kidney disease: Secondary | ICD-10-CM | POA: Diagnosis present

## 2017-08-09 DIAGNOSIS — R0902 Hypoxemia: Secondary | ICD-10-CM | POA: Diagnosis present

## 2017-08-09 DIAGNOSIS — G309 Alzheimer's disease, unspecified: Secondary | ICD-10-CM | POA: Diagnosis present

## 2017-08-09 DIAGNOSIS — F05 Delirium due to known physiological condition: Secondary | ICD-10-CM | POA: Diagnosis not present

## 2017-08-09 DIAGNOSIS — Z79899 Other long term (current) drug therapy: Secondary | ICD-10-CM | POA: Diagnosis not present

## 2017-08-09 DIAGNOSIS — R402413 Glasgow coma scale score 13-15, at hospital admission: Secondary | ICD-10-CM | POA: Diagnosis present

## 2017-08-09 DIAGNOSIS — N179 Acute kidney failure, unspecified: Secondary | ICD-10-CM | POA: Diagnosis present

## 2017-08-09 DIAGNOSIS — Y95 Nosocomial condition: Secondary | ICD-10-CM | POA: Diagnosis present

## 2017-08-09 DIAGNOSIS — F0281 Dementia in other diseases classified elsewhere with behavioral disturbance: Secondary | ICD-10-CM | POA: Diagnosis present

## 2017-08-09 DIAGNOSIS — I493 Ventricular premature depolarization: Secondary | ICD-10-CM | POA: Diagnosis present

## 2017-08-09 DIAGNOSIS — J189 Pneumonia, unspecified organism: Secondary | ICD-10-CM | POA: Diagnosis not present

## 2017-08-09 DIAGNOSIS — R4182 Altered mental status, unspecified: Secondary | ICD-10-CM

## 2017-08-09 DIAGNOSIS — R911 Solitary pulmonary nodule: Secondary | ICD-10-CM | POA: Diagnosis present

## 2017-08-09 DIAGNOSIS — B964 Proteus (mirabilis) (morganii) as the cause of diseases classified elsewhere: Secondary | ICD-10-CM | POA: Diagnosis present

## 2017-08-09 DIAGNOSIS — R509 Fever, unspecified: Secondary | ICD-10-CM | POA: Diagnosis present

## 2017-08-09 DIAGNOSIS — N3000 Acute cystitis without hematuria: Secondary | ICD-10-CM | POA: Diagnosis not present

## 2017-08-09 DIAGNOSIS — Z888 Allergy status to other drugs, medicaments and biological substances status: Secondary | ICD-10-CM

## 2017-08-09 DIAGNOSIS — Z79890 Hormone replacement therapy (postmenopausal): Secondary | ICD-10-CM | POA: Diagnosis not present

## 2017-08-09 DIAGNOSIS — A419 Sepsis, unspecified organism: Secondary | ICD-10-CM

## 2017-08-09 DIAGNOSIS — N390 Urinary tract infection, site not specified: Secondary | ICD-10-CM | POA: Diagnosis present

## 2017-08-09 DIAGNOSIS — Z87891 Personal history of nicotine dependence: Secondary | ICD-10-CM | POA: Diagnosis not present

## 2017-08-09 DIAGNOSIS — E041 Nontoxic single thyroid nodule: Secondary | ICD-10-CM | POA: Diagnosis present

## 2017-08-09 DIAGNOSIS — N183 Chronic kidney disease, stage 3 (moderate): Secondary | ICD-10-CM | POA: Diagnosis not present

## 2017-08-09 DIAGNOSIS — F03918 Unspecified dementia, unspecified severity, with other behavioral disturbance: Secondary | ICD-10-CM | POA: Diagnosis present

## 2017-08-09 DIAGNOSIS — D649 Anemia, unspecified: Secondary | ICD-10-CM

## 2017-08-09 DIAGNOSIS — F0391 Unspecified dementia with behavioral disturbance: Secondary | ICD-10-CM | POA: Diagnosis not present

## 2017-08-09 DIAGNOSIS — A4189 Other specified sepsis: Principal | ICD-10-CM | POA: Diagnosis present

## 2017-08-09 LAB — I-STAT CG4 LACTIC ACID, ED
Lactic Acid, Venous: 2.01 mmol/L (ref 0.5–1.9)
Lactic Acid, Venous: 1.16 mmol/L (ref 0.5–1.9)

## 2017-08-09 LAB — CBC WITH DIFFERENTIAL/PLATELET
BASOS ABS: 0 10*3/uL (ref 0.0–0.1)
Basophils Relative: 0 %
EOS ABS: 0 10*3/uL (ref 0.0–0.7)
Eosinophils Relative: 0 %
HCT: 31.6 % — ABNORMAL LOW (ref 39.0–52.0)
Hemoglobin: 10.3 g/dL — ABNORMAL LOW (ref 13.0–17.0)
Lymphocytes Relative: 6 %
Lymphs Abs: 0.9 10*3/uL (ref 0.7–4.0)
MCH: 29.1 pg (ref 26.0–34.0)
MCHC: 32.6 g/dL (ref 30.0–36.0)
MCV: 89.3 fL (ref 78.0–100.0)
MONO ABS: 0.8 10*3/uL (ref 0.1–1.0)
Monocytes Relative: 5 %
NEUTROS ABS: 14 10*3/uL — AB (ref 1.7–7.7)
Neutrophils Relative %: 89 %
PLATELETS: 209 10*3/uL (ref 150–400)
RBC: 3.54 MIL/uL — ABNORMAL LOW (ref 4.22–5.81)
RDW: 13.7 % (ref 11.5–15.5)
WBC: 15.7 10*3/uL — ABNORMAL HIGH (ref 4.0–10.5)

## 2017-08-09 LAB — COMPREHENSIVE METABOLIC PANEL
ALBUMIN: 2.9 g/dL — AB (ref 3.5–5.0)
ALT: 9 U/L — ABNORMAL LOW (ref 17–63)
ANION GAP: 11 (ref 5–15)
AST: 23 U/L (ref 15–41)
Alkaline Phosphatase: 57 U/L (ref 38–126)
BUN: 18 mg/dL (ref 6–20)
CALCIUM: 8.7 mg/dL — AB (ref 8.9–10.3)
CHLORIDE: 108 mmol/L (ref 101–111)
CO2: 21 mmol/L — AB (ref 22–32)
Creatinine, Ser: 1.55 mg/dL — ABNORMAL HIGH (ref 0.61–1.24)
GFR calc Af Amer: 48 mL/min — ABNORMAL LOW (ref >60)
GFR calc non Af Amer: 42 mL/min — ABNORMAL LOW (ref >60)
GLUCOSE: 92 mg/dL (ref 65–99)
POTASSIUM: 4.2 mmol/L (ref 3.5–5.1)
SODIUM: 140 mmol/L (ref 135–145)
Total Bilirubin: 1 mg/dL (ref 0.3–1.2)
Total Protein: 6 g/dL — ABNORMAL LOW (ref 6.5–8.1)

## 2017-08-09 LAB — URINALYSIS, ROUTINE W REFLEX MICROSCOPIC
BACTERIA UA: NONE SEEN
BILIRUBIN URINE: NEGATIVE
GLUCOSE, UA: NEGATIVE mg/dL
KETONES UR: 20 mg/dL — AB
NITRITE: NEGATIVE
PROTEIN: 30 mg/dL — AB
RBC / HPF: NONE SEEN RBC/hpf (ref 0–5)
Specific Gravity, Urine: 1.025 (ref 1.005–1.030)
pH: 7 (ref 5.0–8.0)

## 2017-08-09 LAB — I-STAT TROPONIN, ED: TROPONIN I, POC: 0 ng/mL (ref 0.00–0.08)

## 2017-08-09 MED ORDER — HYDROCODONE-ACETAMINOPHEN 5-325 MG PO TABS
1.0000 | ORAL_TABLET | ORAL | Status: DC | PRN
Start: 2017-08-09 — End: 2017-08-16
  Filled 2017-08-09: qty 1

## 2017-08-09 MED ORDER — SODIUM CHLORIDE 0.9 % IV BOLUS
500.0000 mL | Freq: Once | INTRAVENOUS | Status: AC
  Administered 2017-08-09: 500 mL via INTRAVENOUS

## 2017-08-09 MED ORDER — LORAZEPAM 2 MG/ML IJ SOLN
0.5000 mg | INTRAMUSCULAR | Status: DC | PRN
  Administered 2017-08-10 – 2017-08-13 (×5): 1 mg via INTRAVENOUS
  Filled 2017-08-09 (×5): qty 1

## 2017-08-09 MED ORDER — PAROXETINE HCL 20 MG PO TABS
20.0000 mg | ORAL_TABLET | Freq: Every day | ORAL | Status: DC
  Administered 2017-08-11 – 2017-08-15 (×5): 20 mg via ORAL
  Filled 2017-08-09 (×7): qty 1

## 2017-08-09 MED ORDER — ACETAMINOPHEN 325 MG PO TABS: 650.0000 mg | ORAL_TABLET | Freq: Four times a day (QID) | ORAL | Status: DC | PRN

## 2017-08-09 MED ORDER — OLANZAPINE 5 MG PO TABS
5.0000 mg | ORAL_TABLET | Freq: Three times a day (TID) | ORAL | Status: DC | PRN
Start: 2017-08-09 — End: 2017-08-16
  Administered 2017-08-14 – 2017-08-15 (×2): 5 mg via ORAL
  Filled 2017-08-09 (×2): qty 1

## 2017-08-09 MED ORDER — SENNOSIDES-DOCUSATE SODIUM 8.6-50 MG PO TABS: 1.0000 | ORAL_TABLET | Freq: Every evening | ORAL | Status: DC | PRN

## 2017-08-09 MED ORDER — IOPAMIDOL (ISOVUE-300) INJECTION 61%
INTRAVENOUS | Status: AC
  Administered 2017-08-09: 60 mL via INTRAVENOUS
  Filled 2017-08-09: qty 100

## 2017-08-09 MED ORDER — LORAZEPAM 2 MG/ML IJ SOLN
0.5000 mg | Freq: Once | INTRAMUSCULAR | Status: AC
  Administered 2017-08-09: 0.5 mg via INTRAVENOUS
  Filled 2017-08-09: qty 1

## 2017-08-09 MED ORDER — ONDANSETRON HCL 4 MG PO TABS: 4.0000 mg | ORAL_TABLET | Freq: Four times a day (QID) | ORAL | Status: DC | PRN

## 2017-08-09 MED ORDER — MEMANTINE HCL ER 28 MG PO CP24
28.0000 mg | ORAL_CAPSULE | Freq: Every day | ORAL | Status: DC
  Administered 2017-08-11 – 2017-08-15 (×5): 28 mg via ORAL
  Filled 2017-08-09 (×6): qty 1

## 2017-08-09 MED ORDER — HEPARIN SODIUM (PORCINE) 5000 UNIT/ML IJ SOLN
5000.0000 [IU] | Freq: Three times a day (TID) | INTRAMUSCULAR | Status: DC
  Administered 2017-08-10 – 2017-08-15 (×15): 5000 [IU] via SUBCUTANEOUS
  Filled 2017-08-09 (×13): qty 1

## 2017-08-09 MED ORDER — DIVALPROEX SODIUM 125 MG PO CSDR
250.0000 mg | DELAYED_RELEASE_CAPSULE | Freq: Three times a day (TID) | ORAL | Status: DC
  Administered 2017-08-09 – 2017-08-15 (×17): 250 mg via ORAL
  Filled 2017-08-09 (×19): qty 2

## 2017-08-09 MED ORDER — ONDANSETRON HCL 4 MG/2ML IJ SOLN: 4.0000 mg | Freq: Four times a day (QID) | INTRAMUSCULAR | Status: DC | PRN

## 2017-08-09 MED ORDER — BISACODYL 5 MG PO TBEC: 5.0000 mg | DELAYED_RELEASE_TABLET | Freq: Every day | ORAL | Status: DC | PRN

## 2017-08-09 MED ORDER — QUETIAPINE FUMARATE 50 MG PO TABS
100.0000 mg | ORAL_TABLET | Freq: Every day | ORAL | Status: DC
  Administered 2017-08-09 – 2017-08-15 (×7): 100 mg via ORAL
  Filled 2017-08-09: qty 1
  Filled 2017-08-09 (×7): qty 2

## 2017-08-09 MED ORDER — ACETAMINOPHEN 650 MG RE SUPP: 650.0000 mg | Freq: Four times a day (QID) | RECTAL | Status: DC | PRN

## 2017-08-09 MED ORDER — CEFEPIME HCL 2 G IJ SOLR
2.0000 g | INTRAMUSCULAR | Status: DC
Start: 2017-08-09 — End: 2017-08-11
  Administered 2017-08-09 – 2017-08-10 (×2): 2 g via INTRAVENOUS
  Filled 2017-08-09 (×2): qty 2

## 2017-08-09 MED ORDER — SODIUM CHLORIDE 0.9 % IV SOLN
INTRAVENOUS | Status: AC
  Administered 2017-08-09: 21:00:00 via INTRAVENOUS

## 2017-08-09 MED ORDER — CEFTRIAXONE SODIUM 1 G IJ SOLR
1.0000 g | Freq: Once | INTRAMUSCULAR | Status: AC
  Administered 2017-08-09: 1 g via INTRAVENOUS
  Filled 2017-08-09: qty 10

## 2017-08-09 MED ORDER — ACETAMINOPHEN 650 MG RE SUPP
650.0000 mg | Freq: Once | RECTAL | Status: AC
  Administered 2017-08-09: 650 mg via RECTAL
  Filled 2017-08-09: qty 1

## 2017-08-09 NOTE — Progress Notes (Addendum)
Pharmacy Antibiotic Note  Antonio Duran is a 77 y.o. male admitted on 08/09/2017 with UTI.  Pharmacy has been consulted for cefepime dosing. Tmax/24h 103.7, wbc 15.7, SCr 1.55 on admit, CrCl~40-45. Received dose of ceftriaxone earlier today.  Plan: Cefepime 2g IV q24h Monitor clinical progress, c/s, renal function F/u de-escalation plan/LOT    Temp (24hrs), Avg:103.7 F (39.8 C), Min:103.7 F (39.8 C), Max:103.7 F (39.8 C)  Recent Labs  Lab 08/09/17 1912 08/09/17 1927  WBC 15.7*  --   CREATININE 1.55*  --   LATICACIDVEN  --  2.01*    CrCl cannot be calculated (Unknown ideal weight.).    Allergies  Allergen Reactions  . Zolpidem Other (See Comments)    Reaction:  Causes pt to sleep walk     Babs BertinHaley Brinn Westby, PharmD, BCPS Clinical Pharmacist 08/09/2017 9:07 PM
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
## 2017-08-09 NOTE — ED Provider Notes (Signed)
[facility] EMERGENCY DEPARTMENT Provider Note   CSN: [id number] Arrival date & time: 08/09/17  1758     History   Chief Complaint Chief Complaint  Patient presents with  . Altered Mental Status    HPI Antonio Duran is a 77 y.o. male.  77 year old male with prior history of Alzheimer's dementia presents for reported decrease in mental status.  Onset of symptoms is unclear.  Patient is a poor historian secondary to his prior dementia.  His baseline mental status appears to be alert and oriented to person only.  On arrival to the ED he is somnolent and difficult to arouse.  Rectal temp is noted to be 103.7.  Staff at [facility] reports that his mental status is changed over the course of today.   He is reportedly a Full Code.   Level 5 Caveat.     The history is provided by the patient, medical records and the EMS personnel. The history is limited by the condition of the patient.  Altered Mental Status   This is a new problem. The current episode started 6 to 12 hours ago. The problem has been gradually improving. Associated symptoms include somnolence and unresponsiveness. His past medical history is significant for dementia.    Past Medical History:  Diagnosis Date  . Alzheimer's dementia   . Enterococcus UTI     Patient Active Problem List   Diagnosis Date Noted  . Mixed dementia   . Palliative care by specialist   . Agitation   . Insomnia   . Enterococcus UTI 01/18/2016  . Confusion 01/18/2016  . Dementia with behavioral disturbance 01/18/2016    No past surgical history on file.      Home Medications    Prior to Admission medications   Medication Sig Start Date End Date Taking? Authorizing Provider  acetaminophen (TYLENOL) 500 MG tablet Take 500 mg by mouth every 4 (four) hours as needed for mild pain, moderate pain, fever or headache.     [provider]  asenapine (SAPHRIS) 5 MG SUBL 24 hr tablet Place 1 tablet (5 mg  total) under the tongue every 12 (twelve) hours as needed (agitation). 07/30/17   Charm Rings, NP  divalproex (DEPAKOTE SPRINKLE) 125 MG capsule Take 250 mg by mouth 3 (three) times daily.     [provider]  estradiol (ESTRACE) 0.5 MG tablet Take 0.5 mg by mouth daily.    [provider]  LORazepam (ATIVAN) 1 MG tablet Take 1 tablet (1 mg total) by mouth every 4 (four) hours as needed for anxiety or sedation. 07/30/17   Charm Rings, NP  memantine (NAMENDA XR) 28 MG CP24 24 hr capsule Take 28 mg by mouth daily.     [provider]  OLANZapine (ZYPREXA) 5 MG tablet Take 1 tablet (5 mg total) by mouth every 8 (eight) hours as needed (for aggitation). 07/30/17   Gwyneth Sprout, MD  PARoxetine (PAXIL) 20 MG tablet Take 1 tablet (20 mg total) by mouth at bedtime. 07/30/17   Charm Rings, NP  QUEtiapine (SEROQUEL) 100 MG tablet Take 1 tablet (100 mg total) by mouth at bedtime. 07/30/17   Charm Rings, NP    Family History Family History  Problem Relation Age of Onset  . Dementia Sister     Social History Social History   Tobacco Use  . Smoking status: Former Games developer  . Smokeless tobacco: Never Used  Substance Use Topics  . Alcohol use: No  .  Drug use: No     Allergies   Zolpidem   Review of Systems Review of Systems  Unable to perform ROS: Dementia  All other systems reviewed and are negative.    Physical Exam Updated Vital Signs BP 125/84   Pulse 88   Temp (!) 103.7 F (39.8 C) (Rectal)   Resp 20   SpO2 100%   Physical Exam  Constitutional: He appears well-developed and well-nourished. No distress.  HENT:  Head: Normocephalic and atraumatic.  Mouth/Throat: Oropharynx is clear and moist.  Eyes: Pupils are equal, round, and reactive to light. Conjunctivae and EOM are normal.  Neck: Normal range of motion. Neck supple.  Cardiovascular: Normal rate, regular rhythm and normal heart sounds.  Pulmonary/Chest: Effort normal and breath  sounds normal. No respiratory distress.  Abdominal: Soft. He exhibits no distension. There is no tenderness.  Musculoskeletal: Normal range of motion. He exhibits no edema or deformity.  Neurological:  Upon initial evaluation patient is somnolent and difficult to arouse.  Patient appears to be confused.  Per records patient's baseline mental status is alert and oriented to person only.  No focal neuro deficit noted.   Skin: Skin is warm and dry.  Psychiatric: He has a normal mood and affect.  Nursing note and vitals reviewed.    ED Treatments / Results  Labs (all labs ordered are listed, but only abnormal results are displayed) Labs Reviewed  URINALYSIS, ROUTINE W REFLEX MICROSCOPIC - Abnormal; Notable for the following components:      Result Value   Color, Urine AMBER (*)    APPearance HAZY (*)    Hgb urine dipstick LARGE (*)    Ketones, ur 20 (*)    Protein, ur 30 (*)    Leukocytes, UA MODERATE (*)    Squamous Epithelial / LPF 0-5 (*)    All other components within normal limits  CBC WITH DIFFERENTIAL/PLATELET - Abnormal; Notable for the following components:   WBC 15.7 (*)    RBC 3.54 (*)    Hemoglobin 10.3 (*)    HCT 31.6 (*)    Neutro Abs 14.0 (*)    All other components within normal limits  COMPREHENSIVE METABOLIC PANEL - Abnormal; Notable for the following components:   CO2 21 (*)    Creatinine, Ser 1.55 (*)    Calcium 8.7 (*)    Total Protein 6.0 (*)    Albumin 2.9 (*)    ALT 9 (*)    GFR calc non Af Amer 42 (*)    GFR calc Af Amer 48 (*)    All other components within normal limits  I-STAT CG4 LACTIC ACID, ED - Abnormal; Notable for the following components:   Lactic Acid, Venous 2.01 (*)    All other components within normal limits  URINE CULTURE  CULTURE, BLOOD (ROUTINE X 2)  CULTURE, BLOOD (ROUTINE X 2)  INFLUENZA PANEL BY PCR (TYPE A & B)  VALPROIC ACID LEVEL  I-STAT TROPONIN, ED  I-STAT CG4 LACTIC ACID, ED    EKG EKG  Interpretation  Date/Time:  Saturday August 09 2017 18:01:49 EDT Ventricular Rate:  100 PR Interval:    QRS Duration: 102 QT Interval:  349 QTC Calculation: 451 R Axis:   -24 Text Interpretation:  Sinus tachycardia Ventricular premature complex Borderline left axis deviation Borderline low voltage, extremity leads Confirmed by Kristine Royal [phone]) on 08/09/2017 6:10:25 PM   Radiology Dg Chest Port 1 View  Result Date: 08/09/2017 CLINICAL DATA:  Sudden onset of altered  mental status. EXAM: PORTABLE CHEST 1 VIEW COMPARISON:  07/28/2017 FINDINGS: Cardiomediastinal silhouette is normal. Mediastinal contours appear intact. Calcific atherosclerotic disease and tortuosity of the aorta. Prominence of bilateral hilar regions. Again seen is a spiculated density overlying the right upper thorax, which may represent a pulmonary nodule. Bilateral lower lobe peribronchial airspace consolidation. Osseous structures are without acute abnormality. Soft tissues are grossly normal. IMPRESSION: Low lung volumes with development of bilateral lower lobe peribronchial airspace consolidation which may represent atelectasis versus airspace disease. Persistent right upper thorax possible pulmonary nodule, for which chest CT still recommended. Prominence of bilateral hilar regions which may represent vascular congestion or hilar lymphadenopathy. Electronically Signed   By: Ted Mcalpineobrinka  Dimitrova M.D.   On: 08/09/2017 19:08    Procedures Procedures (including critical care time)  Medications Ordered in ED Medications  sodium chloride 0.9 % bolus 500 mL (500 mLs Intravenous New Bag/Given 08/09/17 1935)  cefTRIAXone (ROCEPHIN) 1 g in sodium chloride 0.9 % 100 mL IVPB (1 g Intravenous New Bag/Given 08/09/17 2000)  LORazepam (ATIVAN) injection 0.5 mg (has no administration in time range)  acetaminophen (TYLENOL) suppository 650 mg (650 mg Rectal Given 08/09/17 2000)     Initial Impression / Assessment and Plan / ED Course   I have reviewed the triage vital signs and the nursing notes.  Pertinent labs & imaging results that were available during my care of the patient were reviewed by me and considered in my medical decision making (see chart for details).     2030 Reevaluation -  patient is now significantly more alert.  He is oriented to person only.  He denies specific pain. Speech is normal. No facial droop. No focal weakness. Patient mildly agitated and requires mild sedation and sitter for safety.   MDM  Screen complete  Patient is presenting with fever and alteration in mental status.  Mental status is improving in the ED with treatment of his fever.  I suspect that he has a UTI causing his fever.  Patient will be admitted to the hospitalist service for further work-up and treatment.  Chest x-ray does not suggest overt infection or other process.  Baseline labs are suggestive of mild dehydration with associated mild leukocytosis.   Final Clinical Impressions(s) / ED Diagnoses   Final diagnoses:  Altered mental status, unspecified altered mental status type  Fever, unspecified fever cause    ED Discharge Orders    None       Wynetta FinesMessick, Lemonte Al C, MD 08/09/17 2053

## 2017-08-09 NOTE — ED Notes (Signed)
X-ray at bedside

## 2017-08-09 NOTE — H&P (Signed)
History and Physical    Antonio Duran ZOX:[id number] DOB: 04/18/1941 DOA: 08/09/2017  PCP: Uvaldo Bristle, PA-C   Patient coming from: Nursing home   Chief Complaint: Increased lethargy, fevers   HPI: Antonio Duran is a 77 y.o. male with medical history significant for chronic kidney disease stage III, chronic normocytic anemia, and Alzheimer dementia with behavioral disturbance, now presenting to the emergency department from his nursing home for evaluation of increased lethargy and fevers.  Per report of nursing home personnel, the patient has been less interactive today.  EMS was called and he was found to have a fever.  Patient is unable to provide much history given his dementia which appears to be advanced.  There was no recent fall or trauma reported, patient has not been observed to be coughing, and there is no vomiting or diarrhea.  No rash or wound has been identified.  ED Course: Upon arrival to the ED, patient is found to be febrile to 39.8 C, slightly tachycardic, mildly tachypneic, and with stable blood pressure.  EKG features sinus tachycardia with rate 100 and PVC.  Chest x-ray is notable for low lung volumes with new bilateral lower lobe peribronchial consolidation representing atelectasis versus airspace disease, as well as a persistent right upper thoracic nodule.  Chemistry panel is notable for creatinine 1.55, slightly up from his apparent baseline.  CBC features a leukocytosis to 15,700 and a stable normocytic anemia with hemoglobin 10.3.  Lactic acid is slightly elevated to 2.01.  Troponin is undetectable.  Blood and urine cultures were collected, influenza PCR was ordered, and the patient was started on empiric Rocephin for suspected UTI.  He became more alert and interactive when his fever came down.  He remains hemodynamically stable, is in no apparent respiratory distress, and will be admitted to the medical-surgical unit for ongoing evaluation and management of  sepsis, possibly secondary to UTI.  Review of Systems:  All other systems reviewed and apart from HPI, are negative.  Past Medical History:  Diagnosis Date  . Alzheimer's dementia   . Enterococcus UTI     History reviewed. No pertinent surgical history.   reports that he has quit smoking. He has never used smokeless tobacco. He reports that he does not drink alcohol or use drugs.  Allergies  Allergen Reactions  . Zolpidem Other (See Comments)    Reaction:  Causes pt to sleep walk     Family History  Problem Relation Age of Onset  . Dementia Sister      Prior to Admission medications   Medication Sig Start Date End Date Taking? Authorizing Provider  acetaminophen (TYLENOL) 500 MG tablet Take 500 mg by mouth every 4 (four) hours as needed for mild pain, moderate pain, fever or headache.     [provider]  asenapine (SAPHRIS) 5 MG SUBL 24 hr tablet Place 1 tablet (5 mg total) under the tongue every 12 (twelve) hours as needed (agitation). 07/30/17   Charm Rings, NP  divalproex (DEPAKOTE SPRINKLE) 125 MG capsule Take 250 mg by mouth 3 (three) times daily.     [provider]  estradiol (ESTRACE) 0.5 MG tablet Take 0.5 mg by mouth daily.    [provider]  LORazepam (ATIVAN) 1 MG tablet Take 1 tablet (1 mg total) by mouth every 4 (four) hours as needed for anxiety or sedation. 07/30/17   Charm Rings, NP  memantine (NAMENDA XR) 28 MG CP24 24 hr capsule Take 28 mg by mouth daily.  [provider]  OLANZapine (ZYPREXA) 5 MG tablet Take 1 tablet (5 mg total) by mouth every 8 (eight) hours as needed (for aggitation). 07/30/17   Gwyneth SproutPlunkett, Whitney, MD  PARoxetine (PAXIL) 20 MG tablet Take 1 tablet (20 mg total) by mouth at bedtime. 07/30/17   Charm RingsLord, Jamison Y, NP  QUEtiapine (SEROQUEL) 100 MG tablet Take 1 tablet (100 mg total) by mouth at bedtime. 07/30/17   Charm RingsLord, Jamison Y, NP    Physical Exam: Vitals:   08/09/17 1900 08/09/17 1915 08/09/17  1945 08/09/17 2000  BP: 128/90 123/86 (!) 128/51 125/84  Pulse: (!) 25 (!) 101 91 88  Resp: 19 20 (!) 21 20  Temp:      TempSrc:      SpO2: (!) 87% 99% 100% 100%      Constitutional: NAD, anxious, agitated  Eyes: PERTLA, lids and conjunctivae normal ENMT: Mucous membranes are moist. Posterior pharynx clear of any exudate or lesions.   Neck: normal, supple, no masses, no thyromegaly Respiratory: No wheezing, no crackles. Normal respiratory effort.    Cardiovascular: S1 & S2 heard, regular rate and rhythm. No extremity edema. No significant JVD. Abdomen: No distension, no tenderness, soft. Bowel sounds normal.  Musculoskeletal: no clubbing / cyanosis. No joint deformity upper and lower extremities.   Skin: no significant rashes, lesions, ulcers. Warm, dry, well-perfused. Neurologic: No facial asymmetry. Sensation intact. Moving all extremities.  Psychiatric: Alert, but disoriented. Able to be redirected.     Labs on Admission: I have personally reviewed following labs and imaging studies  CBC: Recent Labs  Lab 08/09/17 1912  WBC 15.7*  NEUTROABS 14.0*  HGB 10.3*  HCT 31.6*  MCV 89.3  PLT 209   Basic Metabolic Panel: Recent Labs  Lab 08/09/17 1912  NA 140  K 4.2  CL 108  CO2 21*  GLUCOSE 92  BUN 18  CREATININE 1.55*  CALCIUM 8.7*   GFR: CrCl cannot be calculated (Unknown ideal weight.). Liver Function Tests: Recent Labs  Lab 08/09/17 1912  AST 23  ALT 9*  ALKPHOS 57  BILITOT 1.0  PROT 6.0*  ALBUMIN 2.9*   No results for input(s): LIPASE, AMYLASE in the last 168 hours. No results for input(s): AMMONIA in the last 168 hours. Coagulation Profile: No results for input(s): INR, PROTIME in the last 168 hours. Cardiac Enzymes: No results for input(s): CKTOTAL, CKMB, CKMBINDEX, TROPONINI in the last 168 hours. BNP (last 3 results) No results for input(s): PROBNP in the last 8760 hours. HbA1C: No results for input(s): HGBA1C in the last 72 hours. CBG: No  results for input(s): GLUCAP in the last 168 hours. Lipid Profile: No results for input(s): CHOL, HDL, LDLCALC, TRIG, CHOLHDL, LDLDIRECT in the last 72 hours. Thyroid Function Tests: No results for input(s): TSH, T4TOTAL, FREET4, T3FREE, THYROIDAB in the last 72 hours. Anemia Panel: No results for input(s): VITAMINB12, FOLATE, FERRITIN, TIBC, IRON, RETICCTPCT in the last 72 hours. Urine analysis:    Component Value Date/Time   COLORURINE AMBER (A) 08/09/2017 1819   APPEARANCEUR HAZY (A) 08/09/2017 1819   LABSPEC 1.025 08/09/2017 1819   PHURINE 7.0 08/09/2017 1819   GLUCOSEU NEGATIVE 08/09/2017 1819   HGBUR LARGE (A) 08/09/2017 1819   BILIRUBINUR NEGATIVE 08/09/2017 1819   KETONESUR 20 (A) 08/09/2017 1819   PROTEINUR 30 (A) 08/09/2017 1819   NITRITE NEGATIVE 08/09/2017 1819   LEUKOCYTESUR MODERATE (A) 08/09/2017 1819   Sepsis Labs: @LABRCNTIP (procalcitonin:4,lacticidven:4) )No results found for this or any previous visit (from the past  240 hour(s)).   Radiological Exams on Admission: Dg Chest Port 1 View  Result Date: 08/09/2017 CLINICAL DATA:  Sudden onset of altered mental status. EXAM: PORTABLE CHEST 1 VIEW COMPARISON:  07/28/2017 FINDINGS: Cardiomediastinal silhouette is normal. Mediastinal contours appear intact. Calcific atherosclerotic disease and tortuosity of the aorta. Prominence of bilateral hilar regions. Again seen is a spiculated density overlying the right upper thorax, which may represent a pulmonary nodule. Bilateral lower lobe peribronchial airspace consolidation. Osseous structures are without acute abnormality. Soft tissues are grossly normal. IMPRESSION: Low lung volumes with development of bilateral lower lobe peribronchial airspace consolidation which may represent atelectasis versus airspace disease. Persistent right upper thorax possible pulmonary nodule, for which chest CT still recommended. Prominence of bilateral hilar regions which may represent vascular  congestion or hilar lymphadenopathy. Electronically Signed   By: Ted Mcalpine M.D.   On: 08/09/2017 19:08    EKG: Independently reviewed. Sinus tachycardia (rate 100), PVC.   Assessment/Plan  1. Sepsis, suspected secondary to UTI  - Presents from nursing home with lethargy and fevers  - Found to have high fever, leukocytosis, slight lactate elevation, mild tachypnea, mild tachycardia - CXR features opacities in bilateral lower lobes, but there is no coughing in ED and no apparent dyspnea  - UA with mod leukocytes and TNTC WBC's, but no bacteria seen  - Blood and urine cultures collected in ED and empiric Rocephin administered for suspected UTI  - Influenza PCR pending  - Check procalcitonin, check chest CT for further characterization of lower lobe consolidations and right-sided nodule, hydrate with IVF, continue empiric abx with cefepime while following cultures and clinical course    2. Dementia with behavioral disturbance  - Was somnolent on arrival to ED, became more alert and interactive after fever treated with APAP  - Continue Namenda, Depakote, Seroquel, prn Zyprexa, and prn Ativan    3. CKD stage III  - SCr is 1.55 on admission, slightly up from apparent baseline of ~1.4  - Renally-dose medications, avoid nephrotoxins   4. Normocytic anemia  - Hgb is 10.3 on admission, stable, no bleeding, likely secondary to CKD/chronic disease    5. Intrathoracic nodule  - Right upper thoracic nodule seen on radiographs  - Check CT chest to further eval this and lower lobe consolidations    DVT prophylaxis: sq heparin  Code Status: Full  Family Communication: Discussed with patient Consults called: None Admission status: Inpatient    Briscoe Deutscher, MD [facility] Pager [phone]  If 7PM-7AM, please contact night-coverage [email or web address] Password [email or web address]  08/09/2017, 9:01 PM

## 2017-08-10 DIAGNOSIS — J189 Pneumonia, unspecified organism: Secondary | ICD-10-CM | POA: Diagnosis present

## 2017-08-10 DIAGNOSIS — N390 Urinary tract infection, site not specified: Secondary | ICD-10-CM | POA: Diagnosis present

## 2017-08-10 DIAGNOSIS — R4182 Altered mental status, unspecified: Secondary | ICD-10-CM

## 2017-08-10 LAB — BASIC METABOLIC PANEL
Anion gap: 9 (ref 5–15)
BUN: 16 mg/dL (ref 6–20)
CALCIUM: 8.5 mg/dL — AB (ref 8.9–10.3)
CO2: 20 mmol/L — AB (ref 22–32)
CREATININE: 1.32 mg/dL — AB (ref 0.61–1.24)
Chloride: 111 mmol/L (ref 101–111)
GFR calc Af Amer: 59 mL/min — ABNORMAL LOW (ref >60)
GFR calc non Af Amer: 51 mL/min — ABNORMAL LOW (ref >60)
GLUCOSE: 81 mg/dL (ref 65–99)
Potassium: 3.8 mmol/L (ref 3.5–5.1)
Sodium: 140 mmol/L (ref 135–145)

## 2017-08-10 LAB — CBC WITH DIFFERENTIAL/PLATELET
BASOS PCT: 0 %
Basophils Absolute: 0 10*3/uL (ref 0.0–0.1)
EOS ABS: 0 10*3/uL (ref 0.0–0.7)
Eosinophils Relative: 0 %
HCT: 31.8 % — ABNORMAL LOW (ref 39.0–52.0)
Hemoglobin: 10 g/dL — ABNORMAL LOW (ref 13.0–17.0)
Lymphocytes Relative: 8 %
Lymphs Abs: 1.2 10*3/uL (ref 0.7–4.0)
MCH: 28.2 pg (ref 26.0–34.0)
MCHC: 31.4 g/dL (ref 30.0–36.0)
MCV: 89.6 fL (ref 78.0–100.0)
MONO ABS: 0.9 10*3/uL (ref 0.1–1.0)
MONOS PCT: 6 %
Neutro Abs: 13.2 10*3/uL — ABNORMAL HIGH (ref 1.7–7.7)
Neutrophils Relative %: 86 %
PLATELETS: 234 10*3/uL (ref 150–400)
RBC: 3.55 MIL/uL — ABNORMAL LOW (ref 4.22–5.81)
RDW: 13.9 % (ref 11.5–15.5)
WBC: 15.3 10*3/uL — ABNORMAL HIGH (ref 4.0–10.5)

## 2017-08-10 LAB — INFLUENZA PANEL BY PCR (TYPE A & B)
INFLBPCR: NEGATIVE
Influenza A By PCR: NEGATIVE

## 2017-08-10 LAB — STREP PNEUMONIAE URINARY ANTIGEN: Strep Pneumo Urinary Antigen: NEGATIVE

## 2017-08-10 LAB — PROCALCITONIN
PROCALCITONIN: 1.65 ng/mL
PROCALCITONIN: 2.17 ng/mL

## 2017-08-10 LAB — MRSA PCR SCREENING: MRSA by PCR: NEGATIVE

## 2017-08-10 LAB — LACTIC ACID, PLASMA: LACTIC ACID, VENOUS: 1.7 mmol/L (ref 0.5–1.9)

## 2017-08-10 MED ORDER — SODIUM CHLORIDE 0.9 % IV SOLN
INTRAVENOUS | Status: DC
  Administered 2017-08-10 – 2017-08-13 (×4): via INTRAVENOUS

## 2017-08-10 NOTE — Progress Notes (Signed)
Pt has an order for virtual Chief of Staffsitter/tele sitter. RN called about it and was told if available it would be sent. However, RN explained that pt cannot be redirected. Pt has advanced Alzheimer's and does not understand what he is doing. RN does not believe pt is appropriate for virtual Chief of Staffsitter/tele sitter. Bed alarm is on, in lowest position, socks are on, wrist band is on, and mitts applied due to grabbing at lines (effective). Pt cannot tell this nurse his name and does not respond to simple commands. Will continue to monitor.   Larey Dayshristy M Navjot Loera, RN

## 2017-08-10 NOTE — Progress Notes (Signed)
SLP Cancellation Note  Patient Details Name: Antonio Duran MRN: [id number] DOB: 10/16/1940   Cancelled treatment:       Reason Eval/Treat Not Completed: Fatigue/lethargy limiting ability to participate. Not able to rouse pt with sternal rub, repositioning. Will reattempt next date.  Antonio Duran, [place], CCC-SLP Speech-Language Pathologist [phone]   Antonio Duran 08/10/2017, 2:50 PM

## 2017-08-10 NOTE — Progress Notes (Signed)
[facility]                                                                              Patient Demographics  Antonio Duran, is a 77 y.o. male, DOB - 07-Aug-1940, VSY:[id number]  Admit date - 08/09/2017   Admitting Physician Vianne Bulls, MD  Outpatient Primary MD for the patient is Kurth-Bowen, [name], PA-C  Outpatient specialists:   LOS - 1  days   Medical records reviewed and are as summarized below:    Chief Complaint  Patient presents with  . Altered Mental Status       Brief summary   Patient is a 77 year old male with CKD stage III, chronic normocytic anemia, advanced Alzheimer dementia with behavioral disturbance presented from nursing home for increased lethargy, fevers.  Patient was unable to provide much history.  In ED, patient was found to be febrile 103.7 F, tachycardia, mildly tachypneic.  Chest x-ray showed new bilateral lower lobe peribronchial consolidation versus airspace disease, creatinine 1.5, WBC 15.7, hemoglobin 10.3.  Lactic acid 2.0.  UA positive for UTI.  Patient was admitted for further workup   Assessment & Plan    Principal Problem:   Sepsis ([place]) secondary to UTI, HCAP pneumonia -She met sepsis criteria at the time of admission, febrile 103.7 F, hypoxia 87% on room air, tachycardia 103, lactic acidosis, source likely due to UTI, pneumonia -Follow blood cultures, urine culture, ordered urine strep antigen, urine Legionella antigen -Continue gentle hydration  Active Problems:   UTI (urinary tract infection) -Follow urine culture and sensitivities, continue IV cefepime    HCAP (healthcare-associated pneumonia) CT chest showed a cluster of tiny nodularity in the right apical region likely postinflammatory or infectious etiology. -Influenza panel negative -With dementia, high risk for aspiration, check SLP evaluation    Dementia with behavioral disturbance -Continue Depakote, Paxil, Seroquel and Ativan as  needed  Mild acute on CKD (chronic kidney disease), stage III (HCC) -Baseline creatinine 1.4 -Presented with creatinine of 1.5 at the time of admission with lactic acidosis 2.0 -Patient was placed on gentle hydration, creatinine improved to 1.3    Normocytic anemia -Baseline hemoglobin 10-11, currently at baseline    Lung nodule, solitary -13 mm right upper lobe subpleural nodule corresponding to the nodule seen on the earlier radiographs.  Recommend a noncontrast chest CT in 3-6 months.    Right thyroid gland nodule CT chest also incidentally showed 13 mm nodule posterior to the right thyroid gland may represent exophytic thyroid nodule or parathyroid lesion Recommended nonemergent thyroid ultrasound. Will check thyroid studies.   Code Status:full DVT Prophylaxis:  heparin  Family Communication: Family member at the bedside   Disposition Plan: Hopefully next 24-48 hours  Time Spent in minutes 25 minutes   Procedures:  CT chest  Consultants:   None Antimicrobials:   IV cefepime 3/30   Medications  Scheduled Meds: . divalproex  250 mg Oral TID  . heparin  5,000 Units Subcutaneous Q8H  . memantine  28 mg Oral Daily  . PARoxetine  20 mg Oral QHS  . QUEtiapine  100 mg Oral QHS   Continuous Infusions: . ceFEPime (MAXIPIME)  IV Stopped (08/10/17 0024)   PRN Meds:.acetaminophen **OR** acetaminophen, bisacodyl, HYDROcodone-acetaminophen, LORazepam, OLANZapine, ondansetron **OR** ondansetron (ZOFRAN) IV, senna-docusate   Antibiotics   Anti-infectives (From admission, onward)   Start     Dose/Rate Route Frequency Ordered Stop   08/09/17 2115  ceFEPIme (MAXIPIME) 2 g in sodium chloride 0.9 % 100 mL IVPB     2 g 200 mL/hr over 30 Minutes Intravenous Every 24 hours 08/09/17 2108     08/09/17 1915  cefTRIAXone (ROCEPHIN) 1 g in sodium chloride 0.9 % 100 mL IVPB     1 g 200 mL/hr over 30 Minutes Intravenous  Once 08/09/17 1900 08/09/17 2030        Subjective:    Antonio Duran was seen and examined today.  Dementia, somnolent, had received Ativan.  Unable to obtain review of system from the patient.  Currently afebrile.  Per RN, patient had episodes of agitation overnight.  Objective:   Vitals:   08/09/17 2130 08/09/17 2244 08/10/17 0427 08/10/17 1107  BP: (!) 148/83 135/85 125/76 132/84  Pulse: 93 85 80 78  Resp: (!) '25 19 18 18  ' Temp:  100.1 F (37.8 C) 98.8 F (37.1 C) 98.7 F (37.1 C)  TempSrc:  Oral Oral Oral  SpO2: 100% 100% 100% 100%    Intake/Output Summary (Last 24 hours) at 08/10/2017 1127 Last data filed at 08/10/2017 0900 Gross per 24 hour  Intake 1731.67 ml  Output 0 ml  Net 1731.67 ml     Wt Readings from Last 3 Encounters:  10/21/16 63.5 kg (140 lb)  10/12/16 63.5 kg (140 lb)  09/11/16 63.5 kg (140 lb)     Exam  General: Somnolent, snoring NAD  Eyes:   HEENT:   Cardiovascular: S1 S2 auscultated, . Regular rate and rhythm.  Respiratory: Clear anteriorly   Gastrointestinal: Soft, nontender, nondistended, + bowel sounds  Ext: no pedal edema bilaterally  Neuro: unable to assess, patient not following commands  Musculoskeletal: No digital cyanosis, clubbing  Skin: No rashes  Psych: snoring, advanced dementia, had received Ativan   Data Reviewed:  I have personally reviewed following labs and imaging studies  Micro Results Recent Results (from the past 240 hour(s))  Culture, blood (routine x 2)     Status: None (Preliminary result)   Collection Time: 08/09/17  7:12 PM  Result Value Ref Range Status   Specimen Description BLOOD RIGHT ANTECUBITAL  Final   Special Requests IN PEDIATRIC BOTTLE Blood Culture adequate volume  Final   Culture   Final    NO GROWTH < 24 HOURS Performed at [facility], [address], [place], [place] [address]    Report Status PENDING  Incomplete  Culture, blood (routine x 2)     Status: None (Preliminary result)   Collection Time: 08/09/17  7:30 PM  Result  Value Ref Range Status   Specimen Description BLOOD BLOOD RIGHT FOREARM  Final   Special Requests IN PEDIATRIC BOTTLE Blood Culture adequate volume  Final   Culture   Final    NO GROWTH < 24 HOURS Performed at [facility], [place] [address], [place], [place] [address]    Report Status PENDING  Incomplete  MRSA PCR Screening     Status: None   Collection Time: 08/10/17  3:04 AM  Result Value Ref Range Status   MRSA by PCR NEGATIVE NEGATIVE Final    Comment:        The GeneXpert MRSA Assay (FDA approved for NASAL  specimens only), is one component of a comprehensive MRSA colonization surveillance program. It is not intended to diagnose MRSA infection nor to guide or monitor treatment for MRSA infections. Performed at [facility], [place] [address], [place], [place] [address]     Radiology Reports Dg Chest 1 View  Result Date: 07/28/2017 CLINICAL DATA:  Pt confused, ams, ? Uti. Fever. EXAM: CHEST  1 VIEW COMPARISON:  none FINDINGS: 1 cm spiculated density, right upper lung overlying the anterior aspect of the right third rib. Small bilateral calcified granulomas. Lungs otherwise clear. Heart size and mediastinal contours are within normal limits. No effusion. Visualized bones unremarkable. IMPRESSION: 1. 1 cm right upper lobe nodule. Recommend elective CT chest for further evaluation to exclude neoplasm. Electronically Signed   By: Lucrezia Europe M.D.   On: 07/28/2017 12:40   Ct Chest W Contrast  Result Date: 08/09/2017 CLINICAL DATA:  77 year old male with acute respiratory illness. EXAM: CT CHEST WITH CONTRAST TECHNIQUE: Multidetector CT imaging of the chest was performed during intravenous contrast administration. CONTRAST:  76m ISOVUE-300 IOPAMIDOL (ISOVUE-300) INJECTION 61% COMPARISON:  Chest radiograph dated 08/09/2017 FINDINGS: Cardiovascular: There is no cardiomegaly or pericardial effusion. The thoracic aorta is unremarkable. The origins of the great vessels of the aortic  arch appear patent. The central pulmonary arteries are unremarkable as visualized. Mediastinum/Nodes: Small right hilar calcified granuloma. There is no adenopathy. The esophagus is grossly unremarkable. A 1.3 cm enhancing nodule posterior to the right lobe of the thyroid gland (series 3, image 6) may represent an exophytic thyroid nodule or a parathyroid lesion. Further evaluation with thyroid ultrasound on a nonemergent basis recommended. No mediastinal fluid collection. Lungs/Pleura: There is a 14 mm predominantly linear subpleural nodular density in the right upper lobe corresponding to the density seen on the earlier radiograph most consistent with scarring. Follow-up is recommended to document stability. Small right upper lobe calcified granuloma as well as a calcified granuloma along the right major fissure. Minimal left lung base atelectatic changes. There is a cluster of nodularity in the right apical region (series 5 image 29), likely post inflammatory or infectious in etiology. No consolidative changes. There is no pleural effusion or pneumothorax. The central airways are patent. Upper Abdomen: A 1 cm hypodense liver lesion adjacent to the gallbladder is not characterized on this CT. This can be further evaluated with MRI on a nonemergent basis. The visualized upper abdomen is otherwise unremarkable. Musculoskeletal: No chest wall abnormality. No acute or significant osseous findings. IMPRESSION: 1. Cluster of tiny nodularity in the right apical region likely post inflammatory or infectious in etiology. Clinical correlation is recommended. 2. A 13 mm right upper lobe subpleural nodule corresponding to the nodule seen on the earlier radiographs most likely represent scarring. Non-contrast chest CT at 3-6 months is recommended. If the nodules are stable at time of repeat CT, then future CT at 18-24 months (from today's scan) is considered optional for low-risk patients, but is recommended for high-risk  patients. This recommendation follows the consensus statement: Guidelines for Management of Incidental Pulmonary Nodules Detected on CT Images: From the Fleischner Society 2017; Radiology 2017; 284:228-243. 3. Indeterminate hypodense lesion in the left lobe of the liver. MRI may provide better characterization on a nonemergent basis. 4. A 13 mm nodule posterior to the right thyroid gland may represent an exophytic thyroid nodule or a parathyroid lesion. Further evaluation with thyroid ultrasound on a nonemergent basis recommended. Electronically Signed   By: AAnner CreteM.D.   On:  08/09/2017 22:26   Dg Chest Port 1 View  Result Date: 08/09/2017 CLINICAL DATA:  Sudden onset of altered mental status. EXAM: PORTABLE CHEST 1 VIEW COMPARISON:  07/28/2017 FINDINGS: Cardiomediastinal silhouette is normal. Mediastinal contours appear intact. Calcific atherosclerotic disease and tortuosity of the aorta. Prominence of bilateral hilar regions. Again seen is a spiculated density overlying the right upper thorax, which may represent a pulmonary nodule. Bilateral lower lobe peribronchial airspace consolidation. Osseous structures are without acute abnormality. Soft tissues are grossly normal. IMPRESSION: Low lung volumes with development of bilateral lower lobe peribronchial airspace consolidation which may represent atelectasis versus airspace disease. Persistent right upper thorax possible pulmonary nodule, for which chest CT still recommended. Prominence of bilateral hilar regions which may represent vascular congestion or hilar lymphadenopathy. Electronically Signed   By: Fidela Salisbury M.D.   On: 08/09/2017 19:08    Lab Data:  CBC: Recent Labs  Lab 08/09/17 1912 08/10/17 0600  WBC 15.7* 15.3*  NEUTROABS 14.0* 13.2*  HGB 10.3* 10.0*  HCT 31.6* 31.8*  MCV 89.3 89.6  PLT 209 680   Basic Metabolic Panel: Recent Labs  Lab 08/09/17 1912 08/10/17 0600  NA 140 140  K 4.2 3.8  CL 108 111  CO2  21* 20*  GLUCOSE 92 81  BUN 18 16  CREATININE 1.55* 1.32*  CALCIUM 8.7* 8.5*   GFR: CrCl cannot be calculated (Unknown ideal weight.). Liver Function Tests: Recent Labs  Lab 08/09/17 1912  AST 23  ALT 9*  ALKPHOS 57  BILITOT 1.0  PROT 6.0*  ALBUMIN 2.9*   No results for input(s): LIPASE, AMYLASE in the last 168 hours. No results for input(s): AMMONIA in the last 168 hours. Coagulation Profile: No results for input(s): INR, PROTIME in the last 168 hours. Cardiac Enzymes: No results for input(s): CKTOTAL, CKMB, CKMBINDEX, TROPONINI in the last 168 hours. BNP (last 3 results) No results for input(s): PROBNP in the last 8760 hours. HbA1C: No results for input(s): HGBA1C in the last 72 hours. CBG: No results for input(s): GLUCAP in the last 168 hours. Lipid Profile: No results for input(s): CHOL, HDL, LDLCALC, TRIG, CHOLHDL, LDLDIRECT in the last 72 hours. Thyroid Function Tests: No results for input(s): TSH, T4TOTAL, FREET4, T3FREE, THYROIDAB in the last 72 hours. Anemia Panel: No results for input(s): VITAMINB12, FOLATE, FERRITIN, TIBC, IRON, RETICCTPCT in the last 72 hours. Urine analysis:    Component Value Date/Time   COLORURINE AMBER (A) 08/09/2017 1819   APPEARANCEUR HAZY (A) 08/09/2017 1819   LABSPEC 1.025 08/09/2017 1819   PHURINE 7.0 08/09/2017 1819   GLUCOSEU NEGATIVE 08/09/2017 1819   HGBUR LARGE (A) 08/09/2017 1819   BILIRUBINUR NEGATIVE 08/09/2017 1819   KETONESUR 20 (A) 08/09/2017 1819   PROTEINUR 30 (A) 08/09/2017 1819   NITRITE NEGATIVE 08/09/2017 1819   LEUKOCYTESUR MODERATE (A) 08/09/2017 1819     Neev Mcmains M.D. [facility] 08/10/2017, 11:27 AM  Pager: [phone] Between 7am to 7pm - call Pager - [phone]  After 7pm go to [email or web address] - password [email or web address]  Call night coverage person covering after 7pm

## 2017-08-10 NOTE — Progress Notes (Signed)
New Admission Note:  Arrival Method: By bed around 2230 from ED Mental Orientation: Confused Telemetry: None Assessment: Completed Skin: Completed, refer to flowsheets IV: Right upper arm and forearm bolus and NS going at 100 when arrived Pain: Faces 0 Tubes: None Safety Measures: Safety Fall Prevention Plan was given, discussed. Pt put on low bed, mitts were applied due to grabbing at lines Admission: Completed [facility] Orientation: Patient has been orientated to the room, unit and the staff. Family: None at bedside  Orders have been reviewed and implemented. Will continue to monitor the patient. Call light has been placed within reach and bed alarm has been activated.   Alfonse Rashristy Esteban Kobashigawa, RN  Phone Number: [phone]

## 2017-08-11 LAB — BASIC METABOLIC PANEL
Anion gap: 7 (ref 5–15)
BUN: 15 mg/dL (ref 6–20)
CALCIUM: 8.6 mg/dL — AB (ref 8.9–10.3)
CO2: 21 mmol/L — AB (ref 22–32)
CREATININE: 1.16 mg/dL (ref 0.61–1.24)
Chloride: 114 mmol/L — ABNORMAL HIGH (ref 101–111)
GFR calc Af Amer: >60 mL/min (ref >60)
GFR, EST NON AFRICAN AMERICAN: 59 mL/min — AB (ref >60)
GLUCOSE: 66 mg/dL (ref 65–99)
Potassium: 3.9 mmol/L (ref 3.5–5.1)
Sodium: 142 mmol/L (ref 135–145)

## 2017-08-11 LAB — CBC
HEMATOCRIT: 30.8 % — AB (ref 39.0–52.0)
Hemoglobin: 9.8 g/dL — ABNORMAL LOW (ref 13.0–17.0)
MCH: 28.2 pg (ref 26.0–34.0)
MCHC: 31.8 g/dL (ref 30.0–36.0)
MCV: 88.8 fL (ref 78.0–100.0)
PLATELETS: 234 10*3/uL (ref 150–400)
RBC: 3.47 MIL/uL — ABNORMAL LOW (ref 4.22–5.81)
RDW: 13.8 % (ref 11.5–15.5)
WBC: 8.1 10*3/uL (ref 4.0–10.5)

## 2017-08-11 LAB — URINE CULTURE

## 2017-08-11 LAB — T4, FREE: Free T4: 0.82 ng/dL (ref 0.61–1.12)

## 2017-08-11 LAB — PROCALCITONIN: PROCALCITONIN: 1.98 ng/mL

## 2017-08-11 LAB — LEGIONELLA PNEUMOPHILA SEROGP 1 UR AG: L. PNEUMOPHILA SEROGP 1 UR AG: NEGATIVE

## 2017-08-11 LAB — TSH: TSH: 2.244 u[IU]/mL (ref 0.350–4.500)

## 2017-08-11 MED ORDER — CEFEPIME HCL 2 G IJ SOLR
2.0000 g | Freq: Two times a day (BID) | INTRAMUSCULAR | Status: DC
  Administered 2017-08-11 – 2017-08-12 (×2): 2 g via INTRAVENOUS
  Filled 2017-08-11 (×3): qty 2

## 2017-08-11 NOTE — Progress Notes (Signed)
This RN called staffing again regarding tele sitter and was told that patient was put on a waiting list due to not having one available at this time. All other safety precautions have already been put in place.   Larey Dayshristy M Nura Cahoon, RN

## 2017-08-11 NOTE — Progress Notes (Addendum)
Pharmacy Antibiotic Note  Antonio Duran is a 77 y.o. male admitted on 08/09/2017 with UTI and PNA.  Pharmacy has been consulted for cefepime dosing. Renal function improved, SCr down to 1.16, est norm CrCl ~55 ml/min. UCx growing Proteus mirabilis - sensitivities pending.  Plan: Increase cefepime to 2g IV q12h Monitor clinical progress, c/s, renal function F/u de-escalation plan/LOT    Temp (24hrs), Avg:98.3 F (36.8 C), Min:97.6 F (36.4 C), Max:98.9 F (37.2 C)  Recent Labs  Lab 08/09/17 1912 08/09/17 1927 08/09/17 2206 08/10/17 0002 08/10/17 0600 08/11/17 0712  WBC 15.7*  --   --   --  15.3* 8.1  CREATININE 1.55*  --   --   --  1.32* 1.16  LATICACIDVEN  --  2.01* 1.16 1.7  --   --     CrCl cannot be calculated (Unknown ideal weight.).    Allergies  Allergen Reactions  . Zolpidem Other (See Comments)    Reaction:  Causes pt to sleep walk     Loura BackJennifer [place], PharmD, BCPS Clinical Pharmacist Clinical phone for 08/11/2017 until 4p is [phone] After 4p, please call Main Rx at [phone] for assistance 08/11/2017 1:27 PM

## 2017-08-11 NOTE — Care Management Note (Addendum)
Case Management Note  Patient Details  Name: Antonio Duran MRN: [id number] Date of Birth: 07/31/1940  Subjective/Objective: History of  CKD stage III, chronic normocytic anemia, advanced Alzheimer dementia;  Admitted for Sepsis secondary to UTI, HCAP pneumonia           Action/Plan: Prior to admission patient resided in Nursing Home Facility.  When patient medically stable/bed available to discharge back to SNF.  LCSW "Antonio Duran" following for SNF placement.  Expected Discharge Date:    08/12/2017              Expected Discharge Plan:  Skilled Nursing Facility  In-House Referral:  Clinical Social Work  Discharge planning Services  CM Consult  Status of Service:  In process, will continue to follow  Antonio FlemingsKimberly R Omarius Grantham, RN  Nurse case manager  [place] 08/11/2017, 1:25 PM

## 2017-08-11 NOTE — Evaluation (Signed)
Clinical/Bedside Swallow Evaluation Patient Details  Name: Antonio Duran MRN: [id number] Date of Birth: 03/12/1941  Today's Date: 08/11/2017 Time: SLP Start Time (ACUTE ONLY): 1508 SLP Stop Time (ACUTE ONLY): 1520 SLP Time Calculation (min) (ACUTE ONLY): 12 min  Past Medical History:  Past Medical History:  Diagnosis Date  . Alzheimer's dementia   . Enterococcus UTI    Past Surgical History: History reviewed. No pertinent surgical history. HPI:  Patient is a 77 year old male with CKD stage III, chronic normocytic anemia, advanced Alzheimer dementia with behavioral disturbance presented from nursing home for increased lethargy, fevers.  Patient was unable to provide much history.  In ED, patient was found to be febrile 103.7 F, tachycardia, mildly tachypneic.  Chest x-ray showed new bilateral lower lobe peribronchial consolidation versus airspace disease, creatinine 1.5, WBC 15.7, hemoglobin 10.3.  Lactic acid 2.0.  UA positive for UTI.    Assessment / Plan / Recommendation Clinical Impression  Patient presents with what appears to be a normal oropharyngeal swallow. Oral transit appropriate and patient without overt indication of aspiration. No f/u SLP services indicated.  SLP Visit Diagnosis: Dysphagia, unspecified (R13.10)    Aspiration Risk  Mild aspiration risk    Diet Recommendation Regular;Thin liquid   Liquid Administration via: Cup;Straw Medication Administration: Whole meds with liquid Supervision: Patient able to self feed Compensations: Slow rate;Small sips/bites Postural Changes: Seated upright at 90 degrees    Other  Recommendations Oral Care Recommendations: Oral care BID   Follow up Recommendations None        Swallow Study   General HPI: Patient is a 77 year old male with CKD stage III, chronic normocytic anemia, advanced Alzheimer dementia with behavioral disturbance presented from nursing home for increased lethargy, fevers.  Patient was unable to provide  much history.  In ED, patient was found to be febrile 103.7 F, tachycardia, mildly tachypneic.  Chest x-ray showed new bilateral lower lobe peribronchial consolidation versus airspace disease, creatinine 1.5, WBC 15.7, hemoglobin 10.3.  Lactic acid 2.0.  UA positive for UTI.  Type of Study: Bedside Swallow Evaluation Previous Swallow Assessment: none Diet Prior to this Study: Regular;Thin liquids Temperature Spikes Noted: No Respiratory Status: Room air History of Recent Intubation: No Behavior/Cognition: Alert;Cooperative;Pleasant mood;Confused Oral Cavity Assessment: Within Functional Limits Oral Care Completed by SLP: No Oral Cavity - Dentition: Adequate natural dentition Vision: Functional for self-feeding Patient Positioning: Upright in bed Baseline Vocal Quality: Normal Volitional Cough: Strong Volitional Swallow: Able to elicit    Oral/Motor/Sensory Function Overall Oral Motor/Sensory Function: Within functional limits   Ice Chips Ice chips: Not tested   Thin Liquid Thin Liquid: Within functional limits    Nectar Thick Nectar Thick Liquid: Not tested   Honey Thick Honey Thick Liquid: Not tested   Puree Puree: Within functional limits   Solid   Antonio Vanderlinden MA, CCC-SLP [phone]  Solid: Within functional limits        Antonio Duran Antonio Duran 08/11/2017,3:25 PM

## 2017-08-11 NOTE — Progress Notes (Signed)
Late Entry: At shift change bed alarm was going off. RN went to check on patient. Patient was found sitting on floor mat with IV pole leaning across bed. Pt climbed out of low bed, which was in the lowest position. Pt is very confused and unable to redirect. He has fall band on wrist and yellow socks on. Pt was assessed from head to toe. No injuries were noted. Pt vitals WNL. Patient was helped back to bed and prn medication given due to patient still trying to climb back out of bed. Medication was effective. Patient is resting comfortably in the bed. Will continue to monitor. Son was made aware and was supportive.   Larey Dayshristy M Demarko Zeimet, RN

## 2017-08-11 NOTE — Progress Notes (Signed)
[facility]                                                                              Patient Demographics  Antonio Duran, is a 77 y.o. male, DOB - 03/08/1941, MBW:[id number]  Admit date - 08/09/2017   Admitting Physician Vianne Bulls, MD  Outpatient Primary MD for the patient is Kurth-Bowen, [name], PA-C  Outpatient specialists:   LOS - 2  days   Medical records reviewed and are as summarized below:    Chief Complaint  Patient presents with  . Altered Mental Status       Brief summary   Patient is a 77 year old male with CKD stage III, chronic normocytic anemia, advanced Alzheimer dementia with behavioral disturbance presented from nursing home for increased lethargy, fevers.  Patient was unable to provide much history.  In ED, patient was found to be febrile 103.7 F, tachycardia, mildly tachypneic.  Chest x-ray showed new bilateral lower lobe peribronchial consolidation versus airspace disease, creatinine 1.5, WBC 15.7, hemoglobin 10.3.  Lactic acid 2.0.  UA positive for UTI.  Patient was admitted for further workup   Assessment & Plan    Principal Problem:   Sepsis (Ethan) secondary to UTI, HCAP pneumonia -She met sepsis criteria at the time of admission, febrile 103.7 F, hypoxia 87% on room air, tachycardia 103, lactic acidosis, source likely due to UTI, pneumonia -Blood cultures negative so far, urine culture showed more than 100,000 colonies of Proteus mirabilis  -Urine strep antigen negative.  Follow urine Legionella antigen. -Continue gentle hydration  Active Problems: Proteus UTI (urinary tract infection) -Urine cultures showed Proteus mirabilis, continue IV cefepime, follow sensitivities     HCAP (healthcare-associated pneumonia) CT chest showed a cluster of tiny nodularity in the right apical region likely postinflammatory or infectious etiology. -Influenza panel negative, blood cultures negative so far, urine strep antigen  negative. -Continue IV cefepime, SLP evaluation    Dementia with behavioral disturbance -Continue Depakote, Paxil, Seroquel and Ativan as needed -Sitter as needed  Mild acute on CKD (chronic kidney disease), stage III (HCC) -Baseline creatinine 1.4 -Presented with creatinine of 1.5 at the time of admission with lactic acidosis 2.0 -Improving, creatinine 1.1 today    Normocytic anemia -Baseline hemoglobin 10-11, currently at baseline -Hemoglobin 9.8 likely due to hemodilution.    Lung nodule, solitary -13 mm right upper lobe subpleural nodule corresponding to the nodule seen on the earlier radiographs.  Recommend a noncontrast chest CT in 3-6 months.    Right thyroid gland nodule CT chest also incidentally showed 13 mm nodule posterior to the right thyroid gland may represent exophytic thyroid nodule or parathyroid lesion Recommended nonemergent thyroid ultrasound. -TSH 2.2, free T4 0.8   Code Status:full DVT Prophylaxis:  heparin  Family Communication: No family member at the bedside   Disposition Plan: Hopefully next 24-48 hours  Time Spent in minutes 25 minutes   Procedures:  CT chest  Consultants:   None Antimicrobials:   IV cefepime 3/30   Medications  Scheduled Meds: . divalproex  250 mg Oral TID  . heparin  5,000 Units Subcutaneous Q8H  . memantine  28 mg  Oral Daily  . PARoxetine  20 mg Oral QHS  . QUEtiapine  100 mg Oral QHS   Continuous Infusions: . sodium chloride 75 mL/hr at 08/11/17 0430  . ceFEPime (MAXIPIME) IV Stopped (08/10/17 2147)   PRN Meds:.acetaminophen **OR** acetaminophen, bisacodyl, HYDROcodone-acetaminophen, LORazepam, OLANZapine, ondansetron **OR** ondansetron (ZOFRAN) IV, senna-docusate   Antibiotics   Anti-infectives (From admission, onward)   Start     Dose/Rate Route Frequency Ordered Stop   08/09/17 2115  ceFEPIme (MAXIPIME) 2 g in sodium chloride 0.9 % 100 mL IVPB     2 g 200 mL/hr over 30 Minutes Intravenous Every 24  hours 08/09/17 2108     08/09/17 1915  cefTRIAXone (ROCEPHIN) 1 g in sodium chloride 0.9 % 100 mL IVPB     1 g 200 mL/hr over 30 Minutes Intravenous  Once 08/09/17 1900 08/09/17 2030        Subjective:   Antonio Duran was seen and examined today.  Dementia, somewhat more alert and awake today however disoriented.  Overnight has sundowning.  Currently afebrile.  Difficult to obtain review of system from the patient due to his mental status.  Objective:   Vitals:   08/10/17 2307 08/11/17 0045 08/11/17 0441 08/11/17 0759  BP: 129/86 130/74 128/84 131/81  Pulse: 76 72 (!) 56 (!) 56  Resp: _0 Temp: 98.3 F (36.8 C) 97.9 F (36.6 C) 98.4 F (36.9 C) 97.6 F (36.4 C)  TempSrc: Axillary Axillary Oral Oral  SpO2: 100% 96% 99% 96%    Intake/Output Summary (Last 24 hours) at 08/11/2017 1238 Last data filed at 08/11/2017 0630 Gross per 24 hour  Intake 1643.75 ml  Output 675 ml  Net 968.75 ml     Wt Readings from Last 3 Encounters:  10/21/16 63.5 kg (140 lb)  10/12/16 63.5 kg (140 lb)  09/11/16 63.5 kg (140 lb)     Exam   General: Alert and awake, confused, in ED  Eyes:   HEENT:    Cardiovascular: S1 S2 clear, RRR No pedal edema b/l  Respiratory: Decreased breath sound at the bases  Gastrointestinal: Soft, nontender, nondistended, + bowel sounds  Ext: no pedal edema bilaterally  Neuro: moving all 4 extremities spontaneously however not cooperative with exam  Musculoskeletal: No digital cyanosis, clubbing  Skin: No rashes  Psych: confused, dementia   Data Reviewed:  I have personally reviewed following labs and imaging studies  Micro Results Recent Results (from the past 240 hour(s))  Urine culture     Status: Abnormal (Preliminary result)   Collection Time: 08/09/17  6:19 PM  Result Value Ref Range Status   Specimen Description URINE, CATHETERIZED  Final   Special Requests NONE  Final   Culture (A)  Final    >=100,000 COLONIES/mL PROTEUS  MIRABILIS SUSCEPTIBILITIES TO FOLLOW Performed at [facility], [address] [place], [place] [address]    Report Status PENDING  Incomplete  Culture, blood (routine x 2)     Status: None (Preliminary result)   Collection Time: 08/09/17  7:12 PM  Result Value Ref Range Status   Specimen Description BLOOD RIGHT ANTECUBITAL  Final   Special Requests IN PEDIATRIC BOTTLE Blood Culture adequate volume  Final   Culture   Final    NO GROWTH < 24 HOURS Performed at [facility], [place] [address], [place], [place] [address]    Report Status PENDING  Incomplete  Culture, blood (routine x 2)     Status: None (  Preliminary result)   Collection Time: 08/09/17  7:30 PM  Result Value Ref Range Status   Specimen Description BLOOD BLOOD RIGHT FOREARM  Final   Special Requests IN PEDIATRIC BOTTLE Blood Culture adequate volume  Final   Culture   Final    NO GROWTH < 24 HOURS Performed at [facility], [place] [address], [place], [place] [address]    Report Status PENDING  Incomplete  MRSA PCR Screening     Status: None   Collection Time: 08/10/17  3:04 AM  Result Value Ref Range Status   MRSA by PCR NEGATIVE NEGATIVE Final    Comment:        The GeneXpert MRSA Assay (FDA approved for NASAL specimens only), is one component of a comprehensive MRSA colonization surveillance program. It is not intended to diagnose MRSA infection nor to guide or monitor treatment for MRSA infections. Performed at [facility], [place] [address], [place], [place] [address]     Radiology Reports Dg Chest 1 View  Result Date: 07/28/2017 CLINICAL DATA:  Pt confused, ams, ? Uti. Fever. EXAM: CHEST  1 VIEW COMPARISON:  none FINDINGS: 1 cm spiculated density, right upper lung overlying the anterior aspect of the right third rib. Small bilateral calcified granulomas. Lungs otherwise clear. Heart size and mediastinal contours are within normal limits. No effusion. Visualized bones unremarkable.  IMPRESSION: 1. 1 cm right upper lobe nodule. Recommend elective CT chest for further evaluation to exclude neoplasm. Electronically Signed   By: Lucrezia Europe M.D.   On: 07/28/2017 12:40   Ct Chest W Contrast  Result Date: 08/09/2017 CLINICAL DATA:  77 year old male with acute respiratory illness. EXAM: CT CHEST WITH CONTRAST TECHNIQUE: Multidetector CT imaging of the chest was performed during intravenous contrast administration. CONTRAST:  107m ISOVUE-300 IOPAMIDOL (ISOVUE-300) INJECTION 61% COMPARISON:  Chest radiograph dated 08/09/2017 FINDINGS: Cardiovascular: There is no cardiomegaly or pericardial effusion. The thoracic aorta is unremarkable. The origins of the great vessels of the aortic arch appear patent. The central pulmonary arteries are unremarkable as visualized. Mediastinum/Nodes: Small right hilar calcified granuloma. There is no adenopathy. The esophagus is grossly unremarkable. A 1.3 cm enhancing nodule posterior to the right lobe of the thyroid gland (series 3, image 6) may represent an exophytic thyroid nodule or a parathyroid lesion. Further evaluation with thyroid ultrasound on a nonemergent basis recommended. No mediastinal fluid collection. Lungs/Pleura: There is a 14 mm predominantly linear subpleural nodular density in the right upper lobe corresponding to the density seen on the earlier radiograph most consistent with scarring. Follow-up is recommended to document stability. Small right upper lobe calcified granuloma as well as a calcified granuloma along the right major fissure. Minimal left lung base atelectatic changes. There is a cluster of nodularity in the right apical region (series 5 image 29), likely post inflammatory or infectious in etiology. No consolidative changes. There is no pleural effusion or pneumothorax. The central airways are patent. Upper Abdomen: A 1 cm hypodense liver lesion adjacent to the gallbladder is not characterized on this CT. This can be further evaluated  with MRI on a nonemergent basis. The visualized upper abdomen is otherwise unremarkable. Musculoskeletal: No chest wall abnormality. No acute or significant osseous findings. IMPRESSION: 1. Cluster of tiny nodularity in the right apical region likely post inflammatory or infectious in etiology. Clinical correlation is recommended. 2. A 13 mm right upper lobe subpleural nodule corresponding to the nodule seen on the earlier radiographs most likely represent scarring. Non-contrast chest  CT at 3-6 months is recommended. If the nodules are stable at time of repeat CT, then future CT at 18-24 months (from today's scan) is considered optional for low-risk patients, but is recommended for high-risk patients. This recommendation follows the consensus statement: Guidelines for Management of Incidental Pulmonary Nodules Detected on CT Images: From the Fleischner Society 2017; Radiology 2017; 284:228-243. 3. Indeterminate hypodense lesion in the left lobe of the liver. MRI may provide better characterization on a nonemergent basis. 4. A 13 mm nodule posterior to the right thyroid gland may represent an exophytic thyroid nodule or a parathyroid lesion. Further evaluation with thyroid ultrasound on a nonemergent basis recommended. Electronically Signed   By: Anner Crete M.D.   On: 08/09/2017 22:26   Dg Chest Port 1 View  Result Date: 08/09/2017 CLINICAL DATA:  Sudden onset of altered mental status. EXAM: PORTABLE CHEST 1 VIEW COMPARISON:  07/28/2017 FINDINGS: Cardiomediastinal silhouette is normal. Mediastinal contours appear intact. Calcific atherosclerotic disease and tortuosity of the aorta. Prominence of bilateral hilar regions. Again seen is a spiculated density overlying the right upper thorax, which may represent a pulmonary nodule. Bilateral lower lobe peribronchial airspace consolidation. Osseous structures are without acute abnormality. Soft tissues are grossly normal. IMPRESSION: Low lung volumes with  development of bilateral lower lobe peribronchial airspace consolidation which may represent atelectasis versus airspace disease. Persistent right upper thorax possible pulmonary nodule, for which chest CT still recommended. Prominence of bilateral hilar regions which may represent vascular congestion or hilar lymphadenopathy. Electronically Signed   By: Fidela Salisbury M.D.   On: 08/09/2017 19:08    Lab Data:  CBC: Recent Labs  Lab 08/09/17 1912 08/10/17 0600 08/11/17 0712  WBC 15.7* 15.3* 8.1  NEUTROABS 14.0* 13.2*  --   HGB 10.3* 10.0* 9.8*  HCT 31.6* 31.8* 30.8*  MCV 89.3 89.6 88.8  PLT 209 234 102   Basic Metabolic Panel: Recent Labs  Lab 08/09/17 1912 08/10/17 0600 08/11/17 0712  NA 140 140 142  K 4.2 3.8 3.9  CL 108 111 114*  CO2 21* 20* 21*  GLUCOSE 92 81 66  BUN _0 CREATININE 1.55* 1.32* 1.16  CALCIUM 8.7* 8.5* 8.6*   GFR: CrCl cannot be calculated (Unknown ideal weight.). Liver Function Tests: Recent Labs  Lab 08/09/17 1912  AST 23  ALT 9*  ALKPHOS 57  BILITOT 1.0  PROT 6.0*  ALBUMIN 2.9*   No results for input(s): LIPASE, AMYLASE in the last 168 hours. No results for input(s): AMMONIA in the last 168 hours. Coagulation Profile: No results for input(s): INR, PROTIME in the last 168 hours. Cardiac Enzymes: No results for input(s): CKTOTAL, CKMB, CKMBINDEX, TROPONINI in the last 168 hours. BNP (last 3 results) No results for input(s): PROBNP in the last 8760 hours. HbA1C: No results for input(s): HGBA1C in the last 72 hours. CBG: No results for input(s): GLUCAP in the last 168 hours. Lipid Profile: No results for input(s): CHOL, HDL, LDLCALC, TRIG, CHOLHDL, LDLDIRECT in the last 72 hours. Thyroid Function Tests: Recent Labs    08/11/17 0712  TSH 2.244  FREET4 0.82   Anemia Panel: No results for input(s): VITAMINB12, FOLATE, FERRITIN, TIBC, IRON, RETICCTPCT in the last 72 hours. Urine analysis:    Component Value Date/Time    COLORURINE AMBER (A) 08/09/2017 1819   APPEARANCEUR HAZY (A) 08/09/2017 1819   LABSPEC 1.025 08/09/2017 1819   PHURINE 7.0 08/09/2017 1819   GLUCOSEU NEGATIVE 08/09/2017 1819   HGBUR LARGE (A) 08/09/2017 1819  BILIRUBINUR NEGATIVE 08/09/2017 1819   KETONESUR 20 (A) 08/09/2017 1819   PROTEINUR 30 (A) 08/09/2017 1819   NITRITE NEGATIVE 08/09/2017 1819   LEUKOCYTESUR MODERATE (A) 08/09/2017 1819       M.D. [facility] 08/11/2017, 12:38 PM  Pager: [phone] Between 7am to 7pm - call Pager - [phone]  After 7pm go to [email or web address] - password [email or web address]  Call night coverage person covering after 7pm

## 2017-08-12 LAB — CBC
HCT: 30.2 % — ABNORMAL LOW (ref 39.0–52.0)
HEMOGLOBIN: 9.8 g/dL — AB (ref 13.0–17.0)
MCH: 28.3 pg (ref 26.0–34.0)
MCHC: 32.5 g/dL (ref 30.0–36.0)
MCV: 87.3 fL (ref 78.0–100.0)
Platelets: 259 10*3/uL (ref 150–400)
RBC: 3.46 MIL/uL — ABNORMAL LOW (ref 4.22–5.81)
RDW: 13.5 % (ref 11.5–15.5)
WBC: 5.4 10*3/uL (ref 4.0–10.5)

## 2017-08-12 LAB — BASIC METABOLIC PANEL
Anion gap: 7 (ref 5–15)
BUN: 14 mg/dL (ref 6–20)
CHLORIDE: 111 mmol/L (ref 101–111)
CO2: 21 mmol/L — ABNORMAL LOW (ref 22–32)
Calcium: 8.6 mg/dL — ABNORMAL LOW (ref 8.9–10.3)
Creatinine, Ser: 1.14 mg/dL (ref 0.61–1.24)
GFR calc Af Amer: >60 mL/min (ref >60)
GFR calc non Af Amer: >60 mL/min (ref >60)
GLUCOSE: 92 mg/dL (ref 65–99)
Potassium: 3.6 mmol/L (ref 3.5–5.1)
SODIUM: 139 mmol/L (ref 135–145)

## 2017-08-12 MED ORDER — CEFTRIAXONE SODIUM 1 G IJ SOLR
1.0000 g | INTRAMUSCULAR | Status: DC
Start: 2017-08-12 — End: 2017-08-16
  Administered 2017-08-12 – 2017-08-15 (×4): 1 g via INTRAVENOUS
  Filled 2017-08-12 (×4): qty 10

## 2017-08-12 NOTE — [place] FL2 (Signed)
[place] MEDICAID FL2 LEVEL OF CARE SCREENING TOOL     IDENTIFICATION  Patient Name: Antonio Duran Birthdate: 10/12/1940 Sex: male Admission Date (Current Location): 08/09/2017  [facility] [place]   Facility and Address:  [facility], [address], [place], [place] [address]      Provider Number: [id number]  Attending Physician Name and Address:  Cathren Harshai, Ripudeep K, MD  Relative Name and Phone Number:  Eula FriedSonny Karn, son; [phone]    Current Level of Care: Hospital Recommended Level of Care: Assisted [facility] Prior Approval Number:    Date Approved/Denied:   PASRR Number: [id number] A(Eff. 01/22/16)  Discharge Plan: Other (Comment)([facility])    Current Diagnoses: Patient Active Problem List   Diagnosis Date Noted  . UTI (urinary tract infection) 08/10/2017  . HCAP (healthcare-associated pneumonia) 08/10/2017  . Sepsis, unspecified organism (HCC) 08/09/2017  . CKD (chronic kidney disease), stage III (HCC) 08/09/2017  . Sepsis (HCC) 08/09/2017  . Normocytic anemia   . Lung nodule, solitary   . Mixed dementia   . Palliative care by specialist   . Agitation   . Insomnia   . Enterococcus UTI 01/18/2016  . Confusion 01/18/2016  . Dementia with behavioral disturbance 01/18/2016    Orientation RESPIRATION BLADDER Height & Weight     (Patient disoriented X4)  Normal External catheter, Continent(Condom cath placed 07/3017) Weight:   Height:     BEHAVIORAL SYMPTOMS/MOOD NEUROLOGICAL BOWEL NUTRITION STATUS  Other (Comment)(Pt is very confused and unable to redirect per nursing )   Continent Diet(Regular)  AMBULATORY STATUS COMMUNICATION OF NEEDS Skin   Limited Assist Verbally Other (Comment)(Ecchymosis lower right/left arms)                       Personal Care Assistance Level of Assistance  Bathing, Feeding, Dressing Bathing Assistance: Limited assistance Feeding assistance:  Limited assistance Dressing Assistance: Limited assistance     Functional Limitations Info  Sight, Hearing, Speech Sight Info: Adequate Hearing Info: Adequate Speech Info: Adequate    SPECIAL CARE FACTORS FREQUENCY  Speech therapy             Speech Therapy Frequency: Evaluated 08/11/17(Regular diet, thin liquid recommended)      Contractures Contractures Info: Not present    Additional Factors Info  Code Status, Allergies Code Status Info: Full Allergies Info: Zolpidem           Current Medications (08/12/2017):  This is the current hospital active medication list Current Facility-Administered Medications  Medication Dose Route Frequency Provider Last Rate Last Dose  . 0.9 %  sodium chloride infusion   Intravenous Continuous Rai, Ripudeep K, MD 75 mL/hr at 08/11/17 1711    . acetaminophen (TYLENOL) tablet 650 mg  650 mg Oral Q6H PRN Opyd, Lavone Neriimothy S, MD       Or  . acetaminophen (TYLENOL) suppository 650 mg  650 mg Rectal Q6H PRN Opyd, Lavone Neriimothy S, MD      . bisacodyl (DULCOLAX) EC tablet 5 mg  5 mg Oral Daily PRN Opyd, Lavone Neriimothy S, MD      . cefTRIAXone (ROCEPHIN) 1 g in sodium chloride 0.9 % 100 mL IVPB  1 g Intravenous Q24H Rai, Ripudeep K, MD      . divalproex (DEPAKOTE SPRINKLE) capsule 250 mg  250 mg Oral TID Opyd, Lavone Neriimothy S, MD   250 mg at 08/12/17 0834  . heparin injection 5,000 Units  5,000 Units Subcutaneous Q8H  Briscoe Deutscher, MD   5,000 Units at 08/12/17 [phone]  . HYDROcodone-acetaminophen (NORCO/VICODIN) 5-325 MG per tablet 1-2 tablet  1-2 tablet Oral Q4H PRN Opyd, Lavone Neri, MD      . LORazepam (ATIVAN) injection 0.5-1 mg  0.5-1 mg Intravenous Q4H PRN Opyd, Lavone Neri, MD   1 mg at 08/10/17 2330  . memantine (NAMENDA XR) 24 hr capsule 28 mg  28 mg Oral Daily Opyd, Lavone Neri, MD   28 mg at 08/12/17 0833  . OLANZapine (ZYPREXA) tablet 5 mg  5 mg Oral Q8H PRN Opyd, Lavone Neri, MD      . ondansetron (ZOFRAN) tablet 4 mg  4 mg Oral Q6H PRN Opyd, Lavone Neri, MD       Or   . ondansetron (ZOFRAN) injection 4 mg  4 mg Intravenous Q6H PRN Opyd, Lavone Neri, MD      . PARoxetine (PAXIL) tablet 20 mg  20 mg Oral QHS Opyd, Lavone Neri, MD   20 mg at 08/11/17 2125  . QUEtiapine (SEROQUEL) tablet 100 mg  100 mg Oral QHS Opyd, Lavone Neri, MD   100 mg at 08/11/17 2125  . senna-docusate (Senokot-S) tablet 1 tablet  1 tablet Oral QHS PRN Opyd, Lavone Neri, MD         Discharge Medications: Please see discharge summary for a list of discharge medications.  Relevant Imaging Results:  Relevant Lab Results:   Additional Information ss#[id number]  Cristobal Goldmann, LCSW

## 2017-08-12 NOTE — Progress Notes (Signed)
[facility]                                                                              Patient Demographics  Antonio Duran, is a 77 y.o. male, DOB - 05/18/1940, BTC:[id number]  Admit date - 08/09/2017   Admitting Physician Vianne Bulls, MD  Outpatient Primary MD for the patient is Kurth-Bowen, [name], PA-C  Outpatient specialists:   LOS - 3  days   Medical records reviewed and are as summarized below:    Chief Complaint  Patient presents with  . Altered Mental Status       Brief summary   Patient is a 77 year old male with CKD stage III, chronic normocytic anemia, advanced Alzheimer dementia with behavioral disturbance presented from nursing home for increased lethargy, fevers.  Patient was unable to provide much history.  In ED, patient was found to be febrile 103.7 F, tachycardia, mildly tachypneic.  Chest x-ray showed new bilateral lower lobe peribronchial consolidation versus airspace disease, creatinine 1.5, WBC 15.7, hemoglobin 10.3.  Lactic acid 2.0.  UA positive for UTI.  Patient was admitted for further workup   Assessment & Plan    Principal Problem:   Sepsis ([place]) secondary to UTI, HCAP pneumonia -She met sepsis criteria at the time of admission, febrile 103.7 F, hypoxia 87% on room air, tachycardia 103, lactic acidosis, source likely due to UTI, pneumonia -Blood cultures negative so far, urine culture showed more than 100,000 colonies of Proteus mirabilis  -Urine strep antigen negative.  Urine Legionella antigen negative, influenza panel negative -Continue gentle hydration  Active Problems: Proteus UTI (urinary tract infection) -Urine cultures showed Proteus mirabilis -Narrow antibiotics to IV Rocephin per sensitivities    HCAP (healthcare-associated pneumonia) CT chest showed a cluster of tiny nodularity in the right apical region likely postinflammatory or infectious etiology. -Influenza panel negative, blood cultures negative so  far, urine strep antigen negative.  Urine Legionella antigen negative. -SLP evaluation showed no overt dysphagia, continue regular diet -Antibiotics narrowed to IV Rocephin    Dementia with behavioral disturbance -Continue Depakote, Paxil, Seroquel and Ativan as needed -Sitter as needed  Mild acute on CKD (chronic kidney disease), stage III (HCC) -Baseline creatinine 1.4 -Presented with creatinine of 1.5 at the time of admission with lactic acidosis 2.0 -Creatinine improving, 1.1 today    Normocytic anemia -Baseline hemoglobin 10-11, currently at baseline -H&H stable    Lung nodule, solitary -13 mm right upper lobe subpleural nodule corresponding to the nodule seen on the earlier radiographs.  Recommend a noncontrast chest CT in 3-6 months.    Right thyroid gland nodule CT chest also incidentally showed 13 mm nodule posterior to the right thyroid gland may represent exophytic thyroid nodule or parathyroid lesion Recommended nonemergent thyroid ultrasound. -TSH 2.2, free T4 0.8   Code Status:full DVT Prophylaxis:  heparin  Family Communication: No family member at the bedside   Disposition Plan:  hopefully next 24 hours to skilled nursing facility  Time Spent in minutes 25 minutes   Procedures:  CT chest  Consultants:   None Antimicrobials:   IV cefepime 3/30-> 4/2   Rocephin 4/2   Medications  Scheduled Meds: . divalproex  250 mg Oral TID  . heparin  5,000 Units Subcutaneous Q8H  . memantine  28 mg Oral Daily  . PARoxetine  20 mg Oral QHS  . QUEtiapine  100 mg Oral QHS   Continuous Infusions: . sodium chloride 75 mL/hr at 08/11/17 1711  . cefTRIAXone (ROCEPHIN)  IV 1 g (08/12/17 1347)   PRN Meds:.acetaminophen **OR** acetaminophen, bisacodyl, HYDROcodone-acetaminophen, LORazepam, OLANZapine, ondansetron **OR** ondansetron (ZOFRAN) IV, senna-docusate   Antibiotics   Anti-infectives (From admission, onward)   Start     Dose/Rate Route Frequency Ordered  Stop   08/12/17 1400  cefTRIAXone (ROCEPHIN) 1 g in sodium chloride 0.9 % 100 mL IVPB     1 g 200 mL/hr over 30 Minutes Intravenous Every 24 hours 08/12/17 1108     08/11/17 1400  ceFEPIme (MAXIPIME) 2 g in sodium chloride 0.9 % 100 mL IVPB  Status:  Discontinued     2 g 200 mL/hr over 30 Minutes Intravenous Every 12 hours 08/11/17 1324 08/12/17 1108   08/09/17 2115  ceFEPIme (MAXIPIME) 2 g in sodium chloride 0.9 % 100 mL IVPB  Status:  Discontinued     2 g 200 mL/hr over 30 Minutes Intravenous Every 24 hours 08/09/17 2108 08/11/17 1324   08/09/17 1915  cefTRIAXone (ROCEPHIN) 1 g in sodium chloride 0.9 % 100 mL IVPB     1 g 200 mL/hr over 30 Minutes Intravenous  Once 08/09/17 1900 08/09/17 2030        Subjective:   Antonio Duran was seen and examined today.  Difficult to obtain review of system from the patient due to dementia.  No fevers or chills, no acute issues overnight  Objective:   Vitals:   08/11/17 1729 08/11/17 2054 08/12/17 0500 08/12/17 1016  BP: (!) 160/80 139/90 130/86 130/85  Pulse: 75 79 73 75  Resp: '18 18 18 17  ' Temp: 98.4 F (36.9 C) 98.1 F (36.7 C) 98.4 F (36.9 C) 98.5 F (36.9 C)  TempSrc: Oral Oral Oral Oral  SpO2: 99% 100% 100% 97%    Intake/Output Summary (Last 24 hours) at 08/12/2017 1358 Last data filed at 08/12/2017 1014 Gross per 24 hour  Intake 2442 ml  Output 625 ml  Net 1817 ml     Wt Readings from Last 3 Encounters:  10/21/16 63.5 kg (140 lb)  10/12/16 63.5 kg (140 lb)  09/11/16 63.5 kg (140 lb)     Exam  General: Awake, confused Eyes:  HEENT:   Cardiovascular: S1 S2 clear, Regular rate and rhythm. No pedal edema b/l Respiratory: Scattered rhonchi bilaterally Gastrointestinal: Soft, nontender, nondistended, + bowel sounds Ext: no pedal edema bilaterally Neuro: moving all 4 extremities Musculoskeletal: No digital cyanosis, clubbing Skin: No rashes Psych: confused     Data Reviewed:  I have personally reviewed  following labs and imaging studies  Micro Results Recent Results (from the past 240 hour(s))  Urine culture     Status: Abnormal   Collection Time: 08/09/17  6:19 PM  Result Value Ref Range Status   Specimen Description URINE, CATHETERIZED  Final   Special Requests   Final    NONE Performed at [facility], [place] [address], [place], [place] [address]    Culture >=100,000 COLONIES/mL PROTEUS MIRABILIS (A)  Final   Report Status 08/11/2017 FINAL  Final   Organism ID, Bacteria PROTEUS MIRABILIS (A)  Final      Susceptibility   Proteus mirabilis - MIC*    AMPICILLIN <=  2 SENSITIVE Sensitive     CEFAZOLIN <=4 SENSITIVE Sensitive     CEFTRIAXONE <=1 SENSITIVE Sensitive     CIPROFLOXACIN 1 SENSITIVE Sensitive     GENTAMICIN <=1 SENSITIVE Sensitive     IMIPENEM 1 SENSITIVE Sensitive     NITROFURANTOIN 128 RESISTANT Resistant     TRIMETH/SULFA <=20 SENSITIVE Sensitive     AMPICILLIN/SULBACTAM <=2 SENSITIVE Sensitive     PIP/TAZO <=4 SENSITIVE Sensitive     * >=100,000 COLONIES/mL PROTEUS MIRABILIS  Culture, blood (routine x 2)     Status: None (Preliminary result)   Collection Time: 08/09/17  7:12 PM  Result Value Ref Range Status   Specimen Description BLOOD RIGHT ANTECUBITAL  Final   Special Requests IN PEDIATRIC BOTTLE Blood Culture adequate volume  Final   Culture   Final    NO GROWTH 3 DAYS Performed at [facility], [place] [address], [place], [place] [address]    Report Status PENDING  Incomplete  Culture, blood (routine x 2)     Status: None (Preliminary result)   Collection Time: 08/09/17  7:30 PM  Result Value Ref Range Status   Specimen Description BLOOD BLOOD RIGHT FOREARM  Final   Special Requests IN PEDIATRIC BOTTLE Blood Culture adequate volume  Final   Culture   Final    NO GROWTH 3 DAYS Performed at [facility], [place] [address] [place], [place] [address]    Report Status PENDING  Incomplete  MRSA PCR Screening     Status: None   Collection Time:  08/10/17  3:04 AM  Result Value Ref Range Status   MRSA by PCR NEGATIVE NEGATIVE Final    Comment:        The GeneXpert MRSA Assay (FDA approved for NASAL specimens only), is one component of a comprehensive MRSA colonization surveillance program. It is not intended to diagnose MRSA infection nor to guide or monitor treatment for MRSA infections. Performed at [facility], [place] [address], [place], [place] [address]     Radiology Reports Dg Chest 1 View  Result Date: 07/28/2017 CLINICAL DATA:  Pt confused, ams, ? Uti. Fever. EXAM: CHEST  1 VIEW COMPARISON:  none FINDINGS: 1 cm spiculated density, right upper lung overlying the anterior aspect of the right third rib. Small bilateral calcified granulomas. Lungs otherwise clear. Heart size and mediastinal contours are within normal limits. No effusion. Visualized bones unremarkable. IMPRESSION: 1. 1 cm right upper lobe nodule. Recommend elective CT chest for further evaluation to exclude neoplasm. Electronically Signed   By: Lucrezia Europe M.D.   On: 07/28/2017 12:40   Ct Chest W Contrast  Result Date: 08/09/2017 CLINICAL DATA:  77 year old male with acute respiratory illness. EXAM: CT CHEST WITH CONTRAST TECHNIQUE: Multidetector CT imaging of the chest was performed during intravenous contrast administration. CONTRAST:  8m ISOVUE-300 IOPAMIDOL (ISOVUE-300) INJECTION 61% COMPARISON:  Chest radiograph dated 08/09/2017 FINDINGS: Cardiovascular: There is no cardiomegaly or pericardial effusion. The thoracic aorta is unremarkable. The origins of the great vessels of the aortic arch appear patent. The central pulmonary arteries are unremarkable as visualized. Mediastinum/Nodes: Small right hilar calcified granuloma. There is no adenopathy. The esophagus is grossly unremarkable. A 1.3 cm enhancing nodule posterior to the right lobe of the thyroid gland (series 3, image 6) may represent an exophytic thyroid nodule or a parathyroid lesion. Further  evaluation with thyroid ultrasound on a nonemergent basis recommended. No mediastinal fluid collection. Lungs/Pleura: There is a 14 mm predominantly linear subpleural  nodular density in the right upper lobe corresponding to the density seen on the earlier radiograph most consistent with scarring. Follow-up is recommended to document stability. Small right upper lobe calcified granuloma as well as a calcified granuloma along the right major fissure. Minimal left lung base atelectatic changes. There is a cluster of nodularity in the right apical region (series 5 image 29), likely post inflammatory or infectious in etiology. No consolidative changes. There is no pleural effusion or pneumothorax. The central airways are patent. Upper Abdomen: A 1 cm hypodense liver lesion adjacent to the gallbladder is not characterized on this CT. This can be further evaluated with MRI on a nonemergent basis. The visualized upper abdomen is otherwise unremarkable. Musculoskeletal: No chest wall abnormality. No acute or significant osseous findings. IMPRESSION: 1. Cluster of tiny nodularity in the right apical region likely post inflammatory or infectious in etiology. Clinical correlation is recommended. 2. A 13 mm right upper lobe subpleural nodule corresponding to the nodule seen on the earlier radiographs most likely represent scarring. Non-contrast chest CT at 3-6 months is recommended. If the nodules are stable at time of repeat CT, then future CT at 18-24 months (from today's scan) is considered optional for low-risk patients, but is recommended for high-risk patients. This recommendation follows the consensus statement: Guidelines for Management of Incidental Pulmonary Nodules Detected on CT Images: From the Fleischner Society 2017; Radiology 2017; 284:228-243. 3. Indeterminate hypodense lesion in the left lobe of the liver. MRI may provide better characterization on a nonemergent basis. 4. A 13 mm nodule posterior to the right  thyroid gland may represent an exophytic thyroid nodule or a parathyroid lesion. Further evaluation with thyroid ultrasound on a nonemergent basis recommended. Electronically Signed   By: Anner Crete M.D.   On: 08/09/2017 22:26   Dg Chest Port 1 View  Result Date: 08/09/2017 CLINICAL DATA:  Sudden onset of altered mental status. EXAM: PORTABLE CHEST 1 VIEW COMPARISON:  07/28/2017 FINDINGS: Cardiomediastinal silhouette is normal. Mediastinal contours appear intact. Calcific atherosclerotic disease and tortuosity of the aorta. Prominence of bilateral hilar regions. Again seen is a spiculated density overlying the right upper thorax, which may represent a pulmonary nodule. Bilateral lower lobe peribronchial airspace consolidation. Osseous structures are without acute abnormality. Soft tissues are grossly normal. IMPRESSION: Low lung volumes with development of bilateral lower lobe peribronchial airspace consolidation which may represent atelectasis versus airspace disease. Persistent right upper thorax possible pulmonary nodule, for which chest CT still recommended. Prominence of bilateral hilar regions which may represent vascular congestion or hilar lymphadenopathy. Electronically Signed   By: Fidela Salisbury M.D.   On: 08/09/2017 19:08    Lab Data:  CBC: Recent Labs  Lab 08/09/17 1912 08/10/17 0600 08/11/17 0712 08/12/17 0753  WBC 15.7* 15.3* 8.1 5.4  NEUTROABS 14.0* 13.2*  --   --   HGB 10.3* 10.0* 9.8* 9.8*  HCT 31.6* 31.8* 30.8* 30.2*  MCV 89.3 89.6 88.8 87.3  PLT 209 234 234 075   Basic Metabolic Panel: Recent Labs  Lab 08/09/17 1912 08/10/17 0600 08/11/17 0712 08/12/17 0753  NA 140 140 142 139  K 4.2 3.8 3.9 3.6  CL 108 111 114* 111  CO2 21* 20* 21* 21*  GLUCOSE 92 81 66 92  BUN '18 16 15 14  ' CREATININE 1.55* 1.32* 1.16 1.14  CALCIUM 8.7* 8.5* 8.6* 8.6*   GFR: CrCl cannot be calculated (Unknown ideal weight.). Liver Function Tests: Recent Labs  Lab  08/09/17 1912  AST 23  ALT  9*  ALKPHOS 57  BILITOT 1.0  PROT 6.0*  ALBUMIN 2.9*   No results for input(s): LIPASE, AMYLASE in the last 168 hours. No results for input(s): AMMONIA in the last 168 hours. Coagulation Profile: No results for input(s): INR, PROTIME in the last 168 hours. Cardiac Enzymes: No results for input(s): CKTOTAL, CKMB, CKMBINDEX, TROPONINI in the last 168 hours. BNP (last 3 results) No results for input(s): PROBNP in the last 8760 hours. HbA1C: No results for input(s): HGBA1C in the last 72 hours. CBG: No results for input(s): GLUCAP in the last 168 hours. Lipid Profile: No results for input(s): CHOL, HDL, LDLCALC, TRIG, CHOLHDL, LDLDIRECT in the last 72 hours. Thyroid Function Tests: Recent Labs    08/11/17 0712  TSH 2.244  FREET4 0.82   Anemia Panel: No results for input(s): VITAMINB12, FOLATE, FERRITIN, TIBC, IRON, RETICCTPCT in the last 72 hours. Urine analysis:    Component Value Date/Time   COLORURINE AMBER (A) 08/09/2017 1819   APPEARANCEUR HAZY (A) 08/09/2017 1819   LABSPEC 1.025 08/09/2017 1819   PHURINE 7.0 08/09/2017 1819   GLUCOSEU NEGATIVE 08/09/2017 1819   HGBUR LARGE (A) 08/09/2017 1819   BILIRUBINUR NEGATIVE 08/09/2017 1819   KETONESUR 20 (A) 08/09/2017 1819   PROTEINUR 30 (A) 08/09/2017 1819   NITRITE NEGATIVE 08/09/2017 1819   LEUKOCYTESUR MODERATE (A) 08/09/2017 1819     Rameen Gohlke M.D. [facility] 08/12/2017, 1:58 PM  Pager: [phone] Between 7am to 7pm - call Pager - [phone]  After 7pm go to [email or web address] - password [email or web address]  Call night coverage person covering after 7pm

## 2017-08-12 NOTE — Clinical Social Work Note (Signed)
Clinical Social Work Assessment  Patient Details  Name: Antonio Duran MRN: [id number] Date of Birth: 05/06/1941  Date of referral:  08/12/17               Reason for consult:  Facility Placement, Discharge Planning                Permission sought to share information with:  Family Supports Permission granted to share information::  No(Patient oriented to self only)  Name::     Antonio Duran  Agency::     Relationship::  Son  Contact Information:  [phone]  Housing/Transportation Living arrangements for the past 2 months:  Assisted Living Facility([facility]) Source of Information:  Adult Children Patient Interpreter Needed:  None Criminal Activity/Legal Involvement Pertinent to Current Situation/Hospitalization:  No - Comment as needed Significant Relationships:  Adult Children Lives with:  Facility Resident([facility]) Do you feel safe going back to the place where you live?  Yes Need for family participation in patient care:  Yes (Comment)  Care giving concerns:  Son did not express any concerns regarding patient's care at ALF facility.   Social Worker assessment / plan:  CSW talked with Antonio Duran regarding patient and discharge disposition. [facility] and has been there a little over a year per son. When asked, Antonio Duran replied that the discharge plan is back to ALF. Son reported that he is patient's only child [place].   Employment status:  Retired Database administratornsurance information:  Managed Medicare PT Recommendations:  Not assessed at this time Information / Referral to community resources:  Other (Comment Required)(None needed or requested as d/c plan per son is back to ALF)  Patient/Family's Response to care:  Antonio Duran expressed no concerns regarding his dad's care during hospitalization.  Patient/Family's Understanding of and Emotional Response to Diagnosis, Current Treatment, and Prognosis:  Son  appeared very knowledgeable regarding his dad's medical issues and continued need for care at ALF, where he is a permanent resident.  Emotional Assessment Appearance:  Other (Comment Required(Did not visit with patient, talked with by phone) Attitude/Demeanor/Rapport:  Unable to Assess Affect (typically observed):  Unable to Assess Orientation:  Oriented to Self Alcohol / Substance use:  Tobacco Use, Alcohol Use, Illicit Drugs(Patient reported that he quit smoking and does not drink or use illicit drugs) Psych involvement (Current and /or in the community):  No (Comment)  Discharge Needs  Concerns to be addressed:  Decision making concerns Readmission within the last 30 days:  No Current discharge risk:  None Barriers to Discharge:  Continued Medical Work up   [facility], Antonio ArmsVanessa Bradley, LCSW 08/12/2017, 6:08 PM

## 2017-08-13 DIAGNOSIS — J189 Pneumonia, unspecified organism: Secondary | ICD-10-CM

## 2017-08-13 DIAGNOSIS — N3000 Acute cystitis without hematuria: Secondary | ICD-10-CM

## 2017-08-13 LAB — BASIC METABOLIC PANEL
ANION GAP: 9 (ref 5–15)
BUN: 10 mg/dL (ref 6–20)
CHLORIDE: 114 mmol/L — AB (ref 101–111)
CO2: 18 mmol/L — ABNORMAL LOW (ref 22–32)
Calcium: 8.5 mg/dL — ABNORMAL LOW (ref 8.9–10.3)
Creatinine, Ser: 1 mg/dL (ref 0.61–1.24)
GFR calc non Af Amer: >60 mL/min (ref >60)
Glucose, Bld: 80 mg/dL (ref 65–99)
POTASSIUM: 3.7 mmol/L (ref 3.5–5.1)
SODIUM: 141 mmol/L (ref 135–145)

## 2017-08-13 LAB — CBC
HCT: 30.6 % — ABNORMAL LOW (ref 39.0–52.0)
HEMOGLOBIN: 10.4 g/dL — AB (ref 13.0–17.0)
MCH: 29.5 pg (ref 26.0–34.0)
MCHC: 34 g/dL (ref 30.0–36.0)
MCV: 86.7 fL (ref 78.0–100.0)
Platelets: 234 10*3/uL (ref 150–400)
RBC: 3.53 MIL/uL — AB (ref 4.22–5.81)
RDW: 14 % (ref 11.5–15.5)
WBC: 4.7 10*3/uL (ref 4.0–10.5)

## 2017-08-13 NOTE — Care Management Important Message (Signed)
Important Message  Patient Details  Name: Antonio Duran MRN: [id number] Date of Birth: 01/18/1941   Medicare Important Message Given:  No  Due to illness patient is not able to sign/Unsigned copy left  Lizzy Hamre 08/13/2017, 2:06 PM

## 2017-08-13 NOTE — Progress Notes (Signed)
PROGRESS NOTE  Antonio Duran [id number]:[id number] DOB: August 16, 1940 DOA: 08/09/2017 PCP: Jeanette Caprice, PA-C  HPI/Recap of past 24 hours: Patient is a 77 year old male with CKD stage III, chronic normocytic anemia, advanced Alzheimer dementia with behavioral disturbance presented from nursing home for increased lethargy, fevers.  Patient was unable to provide much history.  In ED, patient was found to be febrile 103.7 F, tachycardia, mildly tachypneic.  Chest x-ray showed new bilateral lower lobe peribronchial consolidation versus airspace disease, creatinine 1.5, WBC 15.7, hemoglobin 10.3.  Lactic acid 2.0.  UA positive for UTI.  Patient was admitted for further workup.  Today, patient reported feeling okay, stated he just wants to get some sleep.  Looks comfortable denies any chest pain, abdominal pain, no fever/chills.  Assessment/Plan: Principal Problem:   Sepsis ([place]) Active Problems:   Dementia with behavioral disturbance   CKD (chronic kidney disease), stage III (HCC)   Normocytic anemia   Lung nodule, solitary   UTI (urinary tract infection)   HCAP (healthcare-associated pneumonia)  Sepsis secondary to UTI, HCAP pneumonia Improving Currently afebrile, with no leukocytosis Met sepsis criteria at the time of admission, febrile 103.7 F, hypoxia 87% on room air, tachycardia 103, lactic acidosis, source likely due to UTI, pneumonia Blood cultures negative so far, urine culture showed more than 100,000 colonies of Proteus mirabilis  Urine strep antigen negative.  Urine Legionella antigen negative, influenza panel negative Continue IV Rocephin  Proteus UTI Urine cultures showed Proteus mirabilis Narrow antibiotics to IV Rocephin per sensitivities  HCAP CT chest showed a cluster of tiny nodularity in the right apical region likely postinflammatory or infectious etiology Influenza panel negative, blood cultures negative so far, urine strep antigen negative.  Urine Legionella  antigen negative. SLP evaluation showed no overt dysphagia, continue regular diet Antibiotics narrowed to IV Rocephin  Dementia with behavioral disturbance Continue Depakote, Paxil, Seroquel and Ativan as needed  AKI Resolved, Cr WNL Presented with creatinine of 1.5 at the time of admission with lactic acidosis 2.0  Normocytic anemia Baseline hemoglobin 10-11, currently at baseline H&H stable  Lung nodule, solitary 13 mm right upper lobe subpleural nodule corresponding to the nodule seen on the earlier radiographs Recommend a noncontrast chest CT in 3-6 months  Right thyroid gland nodule CT chest also incidentally showed 13 mm nodule posterior to the right thyroid gland may represent exophytic thyroid nodule or parathyroid lesion Recommended nonemergent thyroid ultrasound TSH 2.2, free T4 0.8    Code Status: Full  Family Communication: None at bedside  Disposition Plan: Back to nursing home   Consultants:  None  Procedures:  None  Antimicrobials:  IV Rocephin  DVT prophylaxis: Heparin   Objective: Vitals:   08/12/17 2242 08/13/17 0647 08/13/17 0845 08/13/17 1647  BP: 131/67 127/78 130/62 (!) 156/90  Pulse: 64 68 64 (!) 53  Resp: _0 Temp: 98.6 F (37 C) 98.8 F (37.1 C) 98.6 F (37 C) (!) 97.4 F (36.3 C)  TempSrc: Oral Oral Oral Oral  SpO2: 100% 100% 100% 97%  Weight: 63.2 kg (139 lb 5.3 oz)       Intake/Output Summary (Last 24 hours) at 08/13/2017 1811 Last data filed at 08/13/2017 1300 Gross per 24 hour  Intake 1900 ml  Output 2425 ml  Net -525 ml   Filed Weights   08/12/17 2242  Weight: 63.2 kg (139 lb 5.3 oz)    Exam:   General: NAD, confused  Cardiovascular: S1, S2 present, no added heart sounds  Respiratory:  Rhonchi noted bilaterally  Abdomen: Soft, nontender, nondistended, bowel sounds present  Musculoskeletal: No pedal edema bilaterally  Skin: Normal  Psychiatry: Unable to assess   Data  Reviewed: CBC: Recent Labs  Lab 08/09/17 1912 08/10/17 0600 08/11/17 0712 08/12/17 0753 08/13/17 0710  WBC 15.7* 15.3* 8.1 5.4 4.7  NEUTROABS 14.0* 13.2*  --   --   --   HGB 10.3* 10.0* 9.8* 9.8* 10.4*  HCT 31.6* 31.8* 30.8* 30.2* 30.6*  MCV 89.3 89.6 88.8 87.3 86.7  PLT 209 234 234 259 201   Basic Metabolic Panel: Recent Labs  Lab 08/09/17 1912 08/10/17 0600 08/11/17 0712 08/12/17 0753 08/13/17 0710  NA 140 140 142 139 141  K 4.2 3.8 3.9 3.6 3.7  CL 108 111 114* 111 114*  CO2 21* 20* 21* 21* 18*  GLUCOSE 92 81 66 92 80  BUN _0 CREATININE 1.55* 1.32* 1.16 1.14 1.00  CALCIUM 8.7* 8.5* 8.6* 8.6* 8.5*   GFR: CrCl cannot be calculated (Unknown ideal weight.). Liver Function Tests: Recent Labs  Lab 08/09/17 1912  AST 23  ALT 9*  ALKPHOS 57  BILITOT 1.0  PROT 6.0*  ALBUMIN 2.9*   No results for input(s): LIPASE, AMYLASE in the last 168 hours. No results for input(s): AMMONIA in the last 168 hours. Coagulation Profile: No results for input(s): INR, PROTIME in the last 168 hours. Cardiac Enzymes: No results for input(s): CKTOTAL, CKMB, CKMBINDEX, TROPONINI in the last 168 hours. BNP (last 3 results) No results for input(s): PROBNP in the last 8760 hours. HbA1C: No results for input(s): HGBA1C in the last 72 hours. CBG: No results for input(s): GLUCAP in the last 168 hours. Lipid Profile: No results for input(s): CHOL, HDL, LDLCALC, TRIG, CHOLHDL, LDLDIRECT in the last 72 hours. Thyroid Function Tests: Recent Labs    08/11/17 0712  TSH 2.244  FREET4 0.82   Anemia Panel: No results for input(s): VITAMINB12, FOLATE, FERRITIN, TIBC, IRON, RETICCTPCT in the last 72 hours. Urine analysis:    Component Value Date/Time   COLORURINE AMBER (A) 08/09/2017 1819   APPEARANCEUR HAZY (A) 08/09/2017 1819   LABSPEC 1.025 08/09/2017 1819   PHURINE 7.0 08/09/2017 1819   GLUCOSEU NEGATIVE 08/09/2017 1819   HGBUR LARGE (A) 08/09/2017 1819   BILIRUBINUR  NEGATIVE 08/09/2017 1819   KETONESUR 20 (A) 08/09/2017 1819   PROTEINUR 30 (A) 08/09/2017 1819   NITRITE NEGATIVE 08/09/2017 1819   LEUKOCYTESUR MODERATE (A) 08/09/2017 1819   Sepsis Labs: _1 (procalcitonin:4,lacticidven:4)  ) Recent Results (from the past 240 hour(s))  Urine culture     Status: Abnormal   Collection Time: 08/09/17  6:19 PM  Result Value Ref Range Status   Specimen Description URINE, CATHETERIZED  Final   Special Requests   Final    NONE Performed at [facility], [place] [address] [place], [place] [address]    Culture >=100,000 COLONIES/mL PROTEUS MIRABILIS (A)  Final   Report Status 08/11/2017 FINAL  Final   Organism ID, Bacteria PROTEUS MIRABILIS (A)  Final      Susceptibility   Proteus mirabilis - MIC*    AMPICILLIN <=2 SENSITIVE Sensitive     CEFAZOLIN <=4 SENSITIVE Sensitive     CEFTRIAXONE <=1 SENSITIVE Sensitive     CIPROFLOXACIN 1 SENSITIVE Sensitive     GENTAMICIN <=1 SENSITIVE Sensitive     IMIPENEM 1 SENSITIVE Sensitive     NITROFURANTOIN 128 RESISTANT Resistant     TRIMETH/SULFA <=20 SENSITIVE Sensitive  AMPICILLIN/SULBACTAM <=2 SENSITIVE Sensitive     PIP/TAZO <=4 SENSITIVE Sensitive     * >=100,000 COLONIES/mL PROTEUS MIRABILIS  Culture, blood (routine x 2)     Status: None (Preliminary result)   Collection Time: 08/09/17  7:12 PM  Result Value Ref Range Status   Specimen Description BLOOD RIGHT ANTECUBITAL  Final   Special Requests IN PEDIATRIC BOTTLE Blood Culture adequate volume  Final   Culture   Final    NO GROWTH 4 DAYS Performed at [facility], [place] [address] [place], [place] [address]    Report Status PENDING  Incomplete  Culture, blood (routine x 2)     Status: None (Preliminary result)   Collection Time: 08/09/17  7:30 PM  Result Value Ref Range Status   Specimen Description BLOOD BLOOD RIGHT FOREARM  Final   Special Requests IN PEDIATRIC BOTTLE Blood Culture adequate volume  Final   Culture   Final     NO GROWTH 4 DAYS Performed at [facility], [place] [address] [place], [place] [address]    Report Status PENDING  Incomplete  MRSA PCR Screening     Status: None   Collection Time: 08/10/17  3:04 AM  Result Value Ref Range Status   MRSA by PCR NEGATIVE NEGATIVE Final    Comment:        The GeneXpert MRSA Assay (FDA approved for NASAL specimens only), is one component of a comprehensive MRSA colonization surveillance program. It is not intended to diagnose MRSA infection nor to guide or monitor treatment for MRSA infections. Performed at [facility], [place] [address], [place], [place] [address]       Studies: No results found.  Scheduled Meds: . divalproex  250 mg Oral TID  . heparin  5,000 Units Subcutaneous Q8H  . memantine  28 mg Oral Daily  . PARoxetine  20 mg Oral QHS  . QUEtiapine  100 mg Oral QHS    Continuous Infusions: . sodium chloride 75 mL/hr at 08/13/17 1406  . cefTRIAXone (ROCEPHIN)  IV Stopped (08/13/17 1453)     LOS: 4 days     Alma Friendly, MD [facility]   If 7PM-7AM, please contact night-coverage [email or web address] Password [email or web address] 08/13/2017, 6:11 PM

## 2017-08-14 LAB — CULTURE, BLOOD (ROUTINE X 2)
CULTURE: NO GROWTH
Culture: NO GROWTH
SPECIAL REQUESTS: ADEQUATE
SPECIAL REQUESTS: ADEQUATE

## 2017-08-14 NOTE — Progress Notes (Signed)
PROGRESS NOTE  Hommer Cunliffe YJE:[id number] DOB: 01-Dec-1940 DOA: 08/09/2017 PCP: Jeanette Caprice, PA-C  HPI/Recap of past 24 hours: Patient is a 77 year old male with CKD stage III, chronic normocytic anemia, advanced Alzheimer dementia with behavioral disturbance presented from nursing home for increased lethargy, fevers.  Patient was unable to provide much history.  In ED, patient was found to be febrile 103.7 F, tachycardia, mildly tachypneic.  Chest x-ray showed new bilateral lower lobe peribronchial consolidation versus airspace disease, creatinine 1.5, WBC 15.7, hemoglobin 10.3.  Lactic acid 2.0.  UA positive for UTI.  Patient was admitted for further workup.  Today, patient more alert, denies any new complaints, denies any chest pain, abdominal pain, fever/chills.  Looks comfortable  Assessment/Plan: Principal Problem:   Sepsis ([place]) Active Problems:   Dementia with behavioral disturbance   CKD (chronic kidney disease), stage III (HCC)   Normocytic anemia   Lung nodule, solitary   UTI (urinary tract infection)   HCAP (healthcare-associated pneumonia)  Sepsis secondary to UTI, HCAP pneumonia Improving Currently afebrile, with no leukocytosis Met sepsis criteria at the time of admission, febrile 103.7 F, hypoxia 87% on room air, tachycardia 103, lactic acidosis, source likely due to UTI and pneumonia Blood cultures negative so far, urine culture showed more than 100,000 colonies of Proteus mirabilis  Urine strep antigen negative.  Urine Legionella antigen negative, influenza panel negative Continue IV Rocephin  Proteus UTI Urine cultures showed Proteus mirabilis Narrow antibiotics to IV Rocephin per sensitivities  HCAP CT chest showed a cluster of tiny nodularity in the right apical region likely postinflammatory or infectious etiology Influenza panel negative, blood cultures negative so far, urine strep antigen negative.  Urine Legionella antigen negative. SLP  evaluation showed no overt dysphagia, continue regular diet Antibiotics narrowed to IV Rocephin  Dementia with behavioral disturbance Continue Depakote, Paxil, Seroquel and Ativan as needed  AKI Resolved, Cr WNL Presented with creatinine of 1.5 at the time of admission with lactic acidosis 2.0  Normocytic anemia Baseline hemoglobin 10-11, currently at baseline H&H stable  Lung nodule, solitary 13 mm right upper lobe subpleural nodule corresponding to the nodule seen on the earlier radiographs Recommend a noncontrast chest CT in 3-6 months  Right thyroid gland nodule CT chest also incidentally showed 13 mm nodule posterior to the right thyroid gland may represent exophytic thyroid nodule or parathyroid lesion Recommended nonemergent thyroid ultrasound TSH 2.2, free T4 0.8    Code Status: Full  Family Communication: None at bedside  Disposition Plan: Back to nursing home likely 08/15/17   Consultants:  None  Procedures:  None  Antimicrobials:  IV Rocephin  DVT prophylaxis: Heparin   Objective: Vitals:   08/13/17 1816 08/13/17 2128 08/14/17 0639 08/14/17 1541  BP: (!) 158/88 131/78 127/64 (!) 151/95  Pulse:  65 76 (!) 56  Resp:  '20 19 16  ' Temp:  98.5 F (36.9 C) 98.8 F (37.1 C) 98.1 F (36.7 C)  TempSrc:  Oral Oral Oral  SpO2:  98% 99% 96%  Weight:  63.1 kg (139 lb 1.8 oz)    Height:    '5\' 9"'  (1.753 m)    Intake/Output Summary (Last 24 hours) at 08/14/2017 1715 Last data filed at 08/14/2017 0640 Gross per 24 hour  Intake 100 ml  Output 1550 ml  Net -1450 ml   Filed Weights   08/12/17 2242 08/13/17 2128  Weight: 63.2 kg (139 lb 5.3 oz) 63.1 kg (139 lb 1.8 oz)    Exam:   General: NAD, confused  Cardiovascular: S1, S2 present, no added heart sounds  Respiratory: Rhonchi noted bilaterally  Abdomen: Soft, nontender, nondistended, bowel sounds present  Musculoskeletal: No pedal edema bilaterally  Skin: Normal  Psychiatry: Unable to  assess   Data Reviewed: CBC: Recent Labs  Lab 08/09/17 1912 08/10/17 0600 08/11/17 0712 08/12/17 0753 08/13/17 0710  WBC 15.7* 15.3* 8.1 5.4 4.7  NEUTROABS 14.0* 13.2*  --   --   --   HGB 10.3* 10.0* 9.8* 9.8* 10.4*  HCT 31.6* 31.8* 30.8* 30.2* 30.6*  MCV 89.3 89.6 88.8 87.3 86.7  PLT 209 234 234 259 820   Basic Metabolic Panel: Recent Labs  Lab 08/09/17 1912 08/10/17 0600 08/11/17 0712 08/12/17 0753 08/13/17 0710  NA 140 140 142 139 141  K 4.2 3.8 3.9 3.6 3.7  CL 108 111 114* 111 114*  CO2 21* 20* 21* 21* 18*  GLUCOSE 92 81 66 92 80  BUN '18 16 15 14 10  ' CREATININE 1.55* 1.32* 1.16 1.14 1.00  CALCIUM 8.7* 8.5* 8.6* 8.6* 8.5*   GFR: Estimated Creatinine Clearance: 56.1 mL/min (by C-G formula based on SCr of 1 mg/dL). Liver Function Tests: Recent Labs  Lab 08/09/17 1912  AST 23  ALT 9*  ALKPHOS 57  BILITOT 1.0  PROT 6.0*  ALBUMIN 2.9*   No results for input(s): LIPASE, AMYLASE in the last 168 hours. No results for input(s): AMMONIA in the last 168 hours. Coagulation Profile: No results for input(s): INR, PROTIME in the last 168 hours. Cardiac Enzymes: No results for input(s): CKTOTAL, CKMB, CKMBINDEX, TROPONINI in the last 168 hours. BNP (last 3 results) No results for input(s): PROBNP in the last 8760 hours. HbA1C: No results for input(s): HGBA1C in the last 72 hours. CBG: No results for input(s): GLUCAP in the last 168 hours. Lipid Profile: No results for input(s): CHOL, HDL, LDLCALC, TRIG, CHOLHDL, LDLDIRECT in the last 72 hours. Thyroid Function Tests: No results for input(s): TSH, T4TOTAL, FREET4, T3FREE, THYROIDAB in the last 72 hours. Anemia Panel: No results for input(s): VITAMINB12, FOLATE, FERRITIN, TIBC, IRON, RETICCTPCT in the last 72 hours. Urine analysis:    Component Value Date/Time   COLORURINE AMBER (A) 08/09/2017 1819   APPEARANCEUR HAZY (A) 08/09/2017 1819   LABSPEC 1.025 08/09/2017 1819   PHURINE 7.0 08/09/2017 1819    GLUCOSEU NEGATIVE 08/09/2017 1819   HGBUR LARGE (A) 08/09/2017 1819   BILIRUBINUR NEGATIVE 08/09/2017 1819   KETONESUR 20 (A) 08/09/2017 1819   PROTEINUR 30 (A) 08/09/2017 1819   NITRITE NEGATIVE 08/09/2017 1819   LEUKOCYTESUR MODERATE (A) 08/09/2017 1819   Sepsis Labs: '@LABRCNTIP' (procalcitonin:4,lacticidven:4)  ) Recent Results (from the past 240 hour(s))  Urine culture     Status: Abnormal   Collection Time: 08/09/17  6:19 PM  Result Value Ref Range Status   Specimen Description URINE, CATHETERIZED  Final   Special Requests   Final    NONE Performed at [facility], [place] [address] [place], [place] [address]    Culture >=100,000 COLONIES/mL PROTEUS MIRABILIS (A)  Final   Report Status 08/11/2017 FINAL  Final   Organism ID, Bacteria PROTEUS MIRABILIS (A)  Final      Susceptibility   Proteus mirabilis - MIC*    AMPICILLIN <=2 SENSITIVE Sensitive     CEFAZOLIN <=4 SENSITIVE Sensitive     CEFTRIAXONE <=1 SENSITIVE Sensitive     CIPROFLOXACIN 1 SENSITIVE Sensitive     GENTAMICIN <=1 SENSITIVE Sensitive     IMIPENEM 1 SENSITIVE Sensitive  NITROFURANTOIN 128 RESISTANT Resistant     TRIMETH/SULFA <=20 SENSITIVE Sensitive     AMPICILLIN/SULBACTAM <=2 SENSITIVE Sensitive     PIP/TAZO <=4 SENSITIVE Sensitive     * >=100,000 COLONIES/mL PROTEUS MIRABILIS  Culture, blood (routine x 2)     Status: None   Collection Time: 08/09/17  7:12 PM  Result Value Ref Range Status   Specimen Description BLOOD RIGHT ANTECUBITAL  Final   Special Requests IN PEDIATRIC BOTTLE Blood Culture adequate volume  Final   Culture   Final    NO GROWTH 5 DAYS Performed at [facility], [place] [address] [place], [place] [address]    Report Status 08/14/2017 FINAL  Final  Culture, blood (routine x 2)     Status: None   Collection Time: 08/09/17  7:30 PM  Result Value Ref Range Status   Specimen Description BLOOD BLOOD RIGHT FOREARM  Final   Special Requests IN PEDIATRIC BOTTLE Blood Culture  adequate volume  Final   Culture   Final    NO GROWTH 5 DAYS Performed at [facility], [place] [address], [place], [place] [address]    Report Status 08/14/2017 FINAL  Final  MRSA PCR Screening     Status: None   Collection Time: 08/10/17  3:04 AM  Result Value Ref Range Status   MRSA by PCR NEGATIVE NEGATIVE Final    Comment:        The GeneXpert MRSA Assay (FDA approved for NASAL specimens only), is one component of a comprehensive MRSA colonization surveillance program. It is not intended to diagnose MRSA infection nor to guide or monitor treatment for MRSA infections. Performed at [facility], [place] [address] [place], [place] [address]       Studies: No results found.  Scheduled Meds: . divalproex  250 mg Oral TID  . heparin  5,000 Units Subcutaneous Q8H  . memantine  28 mg Oral Daily  . PARoxetine  20 mg Oral QHS  . QUEtiapine  100 mg Oral QHS    Continuous Infusions: . cefTRIAXone (ROCEPHIN)  IV 1 g (08/14/17 1428)     LOS: 5 days     Alma Friendly, MD [facility]   If 7PM-7AM, please contact night-coverage [email or web address] Password [email or web address] 08/14/2017, 5:15 PM

## 2017-08-15 NOTE — Discharge Summary (Signed)
Discharge Summary  Antonio Duran FAO:[id number] DOB: [birth year]  PCP: Jeanette Caprice, PA-C  Admit date: 08/09/2017 Discharge date: 08/15/2017  Time spent: 35 mins  Recommendations for Outpatient Follow-up:  1. PCP   Discharge Diagnoses:  Active Hospital Problems   Diagnosis Date Noted  . Sepsis ([place]) 08/09/2017  . UTI (urinary tract infection) 08/10/2017  . HCAP (healthcare-associated pneumonia) 08/10/2017  . CKD (chronic kidney disease), stage III ([place]) 08/09/2017  . Normocytic anemia   . Lung nodule, solitary   . Dementia with behavioral disturbance 01/18/2016    Resolved Hospital Problems  No resolved problems to display.    Discharge Condition: Stable  Diet recommendation: Heart healthy  Vitals:   08/15/17 0625 08/15/17 0817  BP: (!) 173/99 (!) 183/97  Pulse: (!) 50 (!) 48  Resp:  14  Temp: 98.7 F (37.1 C) 98.1 F (36.7 C)  SpO2: 100% 100%    History of present illness:  Patient is a 77 year old male with CKD stage III, chronic normocytic anemia, advanced Alzheimer dementia with behavioral disturbance presented from nursing home for increased lethargy, fevers. Patient was unable to provide much history. In ED, patient was found to be febrile 103.7 F, tachycardia, mildly tachypneic. Chest x-ray showed new bilateral lower lobe peribronchial consolidation versus airspace disease, creatinine 1.5, WBC 15.7, hemoglobin 10.3. Lactic acid 2.0. UA positive for UTI. Patient was admitted for further workup.  Today, met pt sleeping, easily arousable. Pt denies any chest pain, abdominal pain, fever/chills.  Looks comfortable. Stable for discharge back to assisted living  Hospital Course:  Principal Problem:   Sepsis ([place]) Active Problems:   Dementia with behavioral disturbance   CKD (chronic kidney disease), stage III (H[facility])   Normocytic anemia   Lung nodule, solitary   UTI (urinary tract infection)   HCAP (healthcare-associated pneumonia)  Sepsis  secondary to UTI, HCAP pneumonia Improved Currently afebrile, with no leukocytosis Met sepsis criteria at the time of admission, febrile 103.7 F, hypoxia 87% on room air, tachycardia 103, lactic acidosis, source likely due to UTI and pneumonia Blood cultures negative so far, urine culture showed more than 100,000 colonies of proteus mirabilis  Urine strep antigen negative.Urine Legionella antigen negative, influenza panel negative Completed 7 days of IV antibiotics  Proteus UTI Urine cultures showed Proteus mirabilis Narrow antibiotics to IV Rocephin per sensitivities Completed 7 days of IV antibitotics  HCAP CT chest showed a cluster of tiny nodularity in the right apical region likely postinflammatory or infectious etiology Influenza panel negative, blood cultures negative so far, urine strep antigen negative.Urine Legionella antigen negative. SLP evaluation showed no overt dysphagia, continue regular diet Completed 7 days of IV antibiotics   Dementia with behavioral disturbance Continue Depakote, Paxil, Seroquel and Ativan as needed  AKI Resolved, Cr WNL Presented with creatinine of 1.5 at the time of admission with lactic acidosis 2.0  Normocytic anemia Baseline hemoglobin 10-11, currently at baseline H&H stable  Lung nodule, solitary 13 mm right upper lobe subpleural nodule corresponding to the nodule seen on the earlier radiographs Recommend a noncontrast chest CT in 3-6 months, pcp to follow up  Right thyroid gland nodule CT chest also incidentally showed 13 mm nodule posterior to the right thyroid gland may represent exophytic thyroid nodule or parathyroid lesion Recommended nonemergent thyroid ultrasound as an outpt, pcp to follow up TSH 2.2, free T4 0.8      Procedures:  None  Consultations:  None  Discharge Exam: BP (!) 183/97 (BP Location: Right Arm)   Pulse (!) 48  Temp 98.1 F (36.7 C) (Oral)   Resp 14   Ht '5\' 9"'  (1.753 m) Comment:  10/21/16  Wt 67 kg (147 lb 11.3 oz)   SpO2 100%   BMI 21.81 kg/m   General: NAD, alert, no oriented Cardiovascular: S1, S2 present, no added hrt sound Respiratory: Chest clear bilaterally   Discharge Instructions You were cared for by a hospitalist during your hospital stay. If you have any questions about your discharge medications or the care you received while you were in the hospital after you are discharged, you can call the unit and asked to speak with the hospitalist on call if the hospitalist that took care of you is not available. Once you are discharged, your primary care physician will handle any further medical issues. Please note that NO REFILLS for any discharge medications will be authorized once you are discharged, as it is imperative that you return to your primary care physician (or establish a relationship with a primary care physician if you do not have one) for your aftercare needs so that they can reassess your need for medications and monitor your lab values.   Allergies as of 08/15/2017      Reactions   Zolpidem Other (See Comments)   Reaction:  Causes pt to sleep walk       Medication List    STOP taking these medications   asenapine 5 MG Subl 24 hr tablet Commonly known as:  SAPHRIS     TAKE these medications   acetaminophen 500 MG tablet Commonly known as:  TYLENOL Take 500 mg by mouth every 4 (four) hours as needed for mild pain, moderate pain, fever or headache.   alum & mag hydroxide-simeth 200-200-20 MG/5ML suspension Commonly known as:  MAALOX/MYLANTA Take 30 mLs by mouth every 6 (six) hours as needed for indigestion or heartburn.   divalproex 125 MG capsule Commonly known as:  DEPAKOTE SPRINKLE Take 250 mg by mouth 3 (three) times daily.   estradiol 0.5 MG tablet Commonly known as:  ESTRACE Take 0.5 mg by mouth daily.   guaifenesin 100 MG/5ML syrup Commonly known as:  ROBITUSSIN Take 200 mg by mouth every 6 (six) hours as needed for  cough.   loperamide 2 MG capsule Commonly known as:  IMODIUM Take 2 mg by mouth as needed for diarrhea or loose stools.   LORazepam 1 MG tablet Commonly known as:  ATIVAN Take 1 tablet (1 mg total) by mouth every 4 (four) hours as needed for anxiety or sedation.   magnesium hydroxide 400 MG/5ML suspension Commonly known as:  MILK OF MAGNESIA Take 30 mLs by mouth at bedtime as needed for mild constipation.   NAMENDA XR 28 MG Cp24 24 hr capsule Generic drug:  memantine Take 28 mg by mouth daily.   neomycin-bacitracin-polymyxin [phone] ointment Apply 1 application topically 4 (four) times daily.   OLANZapine 5 MG tablet Commonly known as:  ZYPREXA Take 1 tablet (5 mg total) by mouth every 8 (eight) hours as needed (for aggitation).   PARoxetine 20 MG tablet Commonly known as:  PAXIL Take 1 tablet (20 mg total) by mouth at bedtime.   QUEtiapine 25 MG tablet Commonly known as:  SEROQUEL Take 25 mg by mouth See admin instructions. Take 1 tablet (25 mg totally) by mouth every day at 2 PM   QUEtiapine 100 MG tablet Commonly known as:  SEROQUEL Take 1 tablet (100 mg total) by mouth at bedtime.   traZODone 150 MG tablet Commonly known  as:  DESYREL Take 75 mg by mouth at bedtime.      Allergies  Allergen Reactions  . Zolpidem Other (See Comments)    Reaction:  Causes pt to sleep walk    Follow-up Information    Kurth-Bowen, [name], PA-C. Schedule an appointment as soon as possible for a visit in 1 week(s).   Specialty:  Physician Assistant Contact information: [address] [place] [address] [phone]            The results of significant diagnostics from this hospitalization (including imaging, microbiology, ancillary and laboratory) are listed below for reference.    Significant Diagnostic Studies: Dg Chest 1 View  Result Date: 07/28/2017 CLINICAL DATA:  Pt confused, ams, ? Uti. Fever. EXAM: CHEST  1 VIEW COMPARISON:  none FINDINGS: 1 cm  spiculated density, right upper lung overlying the anterior aspect of the right third rib. Small bilateral calcified granulomas. Lungs otherwise clear. Heart size and mediastinal contours are within normal limits. No effusion. Visualized bones unremarkable. IMPRESSION: 1. 1 cm right upper lobe nodule. Recommend elective CT chest for further evaluation to exclude neoplasm. Electronically Signed   By: Lucrezia Europe M.D.   On: 07/28/2017 12:40   Ct Chest W Contrast  Result Date: 08/09/2017 CLINICAL DATA:  77 year old male with acute respiratory illness. EXAM: CT CHEST WITH CONTRAST TECHNIQUE: Multidetector CT imaging of the chest was performed during intravenous contrast administration. CONTRAST:  56m ISOVUE-300 IOPAMIDOL (ISOVUE-300) INJECTION 61% COMPARISON:  Chest radiograph dated 08/09/2017 FINDINGS: Cardiovascular: There is no cardiomegaly or pericardial effusion. The thoracic aorta is unremarkable. The origins of the great vessels of the aortic arch appear patent. The central pulmonary arteries are unremarkable as visualized. Mediastinum/Nodes: Small right hilar calcified granuloma. There is no adenopathy. The esophagus is grossly unremarkable. A 1.3 cm enhancing nodule posterior to the right lobe of the thyroid gland (series 3, image 6) may represent an exophytic thyroid nodule or a parathyroid lesion. Further evaluation with thyroid ultrasound on a nonemergent basis recommended. No mediastinal fluid collection. Lungs/Pleura: There is a 14 mm predominantly linear subpleural nodular density in the right upper lobe corresponding to the density seen on the earlier radiograph most consistent with scarring. Follow-up is recommended to document stability. Small right upper lobe calcified granuloma as well as a calcified granuloma along the right major fissure. Minimal left lung base atelectatic changes. There is a cluster of nodularity in the right apical region (series 5 image 29), likely post inflammatory or  infectious in etiology. No consolidative changes. There is no pleural effusion or pneumothorax. The central airways are patent. Upper Abdomen: A 1 cm hypodense liver lesion adjacent to the gallbladder is not characterized on this CT. This can be further evaluated with MRI on a nonemergent basis. The visualized upper abdomen is otherwise unremarkable. Musculoskeletal: No chest wall abnormality. No acute or significant osseous findings. IMPRESSION: 1. Cluster of tiny nodularity in the right apical region likely post inflammatory or infectious in etiology. Clinical correlation is recommended. 2. A 13 mm right upper lobe subpleural nodule corresponding to the nodule seen on the earlier radiographs most likely represent scarring. Non-contrast chest CT at 3-6 months is recommended. If the nodules are stable at time of repeat CT, then future CT at 18-24 months (from today's scan) is considered optional for low-risk patients, but is recommended for high-risk patients. This recommendation follows the consensus statement: Guidelines for Management of Incidental Pulmonary Nodules Detected on CT Images: From the Fleischner Society 2017; Radiology 2017; 284:228-243. 3.  Indeterminate hypodense lesion in the left lobe of the liver. MRI may provide better characterization on a nonemergent basis. 4. A 13 mm nodule posterior to the right thyroid gland may represent an exophytic thyroid nodule or a parathyroid lesion. Further evaluation with thyroid ultrasound on a nonemergent basis recommended. Electronically Signed   By: Anner Crete M.D.   On: 08/09/2017 22:26   Dg Chest Port 1 View  Result Date: 08/09/2017 CLINICAL DATA:  Sudden onset of altered mental status. EXAM: PORTABLE CHEST 1 VIEW COMPARISON:  07/28/2017 FINDINGS: Cardiomediastinal silhouette is normal. Mediastinal contours appear intact. Calcific atherosclerotic disease and tortuosity of the aorta. Prominence of bilateral hilar regions. Again seen is a spiculated  density overlying the right upper thorax, which may represent a pulmonary nodule. Bilateral lower lobe peribronchial airspace consolidation. Osseous structures are without acute abnormality. Soft tissues are grossly normal. IMPRESSION: Low lung volumes with development of bilateral lower lobe peribronchial airspace consolidation which may represent atelectasis versus airspace disease. Persistent right upper thorax possible pulmonary nodule, for which chest CT still recommended. Prominence of bilateral hilar regions which may represent vascular congestion or hilar lymphadenopathy. Electronically Signed   By: Fidela Salisbury M.D.   On: 08/09/2017 19:08    Microbiology: Recent Results (from the past 240 hour(s))  Urine culture     Status: Abnormal   Collection Time: 08/09/17  6:19 PM  Result Value Ref Range Status   Specimen Description URINE, CATHETERIZED  Final   Special Requests   Final    NONE Performed at [facility], [address] [place], [place] [address]    Culture >=100,000 COLONIES/mL PROTEUS MIRABILIS (A)  Final   Report Status 08/11/2017 FINAL  Final   Organism ID, Bacteria PROTEUS MIRABILIS (A)  Final      Susceptibility   Proteus mirabilis - MIC*    AMPICILLIN <=2 SENSITIVE Sensitive     CEFAZOLIN <=4 SENSITIVE Sensitive     CEFTRIAXONE <=1 SENSITIVE Sensitive     CIPROFLOXACIN 1 SENSITIVE Sensitive     GENTAMICIN <=1 SENSITIVE Sensitive     IMIPENEM 1 SENSITIVE Sensitive     NITROFURANTOIN 128 RESISTANT Resistant     TRIMETH/SULFA <=20 SENSITIVE Sensitive     AMPICILLIN/SULBACTAM <=2 SENSITIVE Sensitive     PIP/TAZO <=4 SENSITIVE Sensitive     * >=100,000 COLONIES/mL PROTEUS MIRABILIS  Culture, blood (routine x 2)     Status: None   Collection Time: 08/09/17  7:12 PM  Result Value Ref Range Status   Specimen Description BLOOD RIGHT ANTECUBITAL  Final   Special Requests IN PEDIATRIC BOTTLE Blood Culture adequate volume  Final   Culture   Final    NO GROWTH  5 DAYS Performed at [facility] [address] [place], [place] [address]    Report Status 08/14/2017 FINAL  Final  Culture, blood (routine x 2)     Status: None   Collection Time: 08/09/17  7:30 PM  Result Value Ref Range Status   Specimen Description BLOOD BLOOD RIGHT FOREARM  Final   Special Requests IN PEDIATRIC BOTTLE Blood Culture adequate volume  Final   Culture   Final    NO GROWTH 5 DAYS Performed at [facility], [place] [address], [place], [place] [address]    Report Status 08/14/2017 FINAL  Final  MRSA PCR Screening     Status: None   Collection Time: 08/10/17  3:04 AM  Result Value Ref Range Status   MRSA by  PCR NEGATIVE NEGATIVE Final    Comment:        The GeneXpert MRSA Assay (FDA approved for NASAL specimens only), is one component of a comprehensive MRSA colonization surveillance program. It is not intended to diagnose MRSA infection nor to guide or monitor treatment for MRSA infections. Performed at [facility], [place] [address] [place], [place] [address]      Labs: Basic Metabolic Panel: Recent Labs  Lab 08/09/17 1912 08/10/17 0600 08/11/17 0712 08/12/17 0753 08/13/17 0710  NA 140 140 142 139 141  K 4.2 3.8 3.9 3.6 3.7  CL 108 111 114* 111 114*  CO2 21* 20* 21* 21* 18*  GLUCOSE 92 81 66 92 80  BUN '18 16 15 14 10  ' CREATININE 1.55* 1.32* 1.16 1.14 1.00  CALCIUM 8.7* 8.5* 8.6* 8.6* 8.5*   Liver Function Tests: Recent Labs  Lab 08/09/17 1912  AST 23  ALT 9*  ALKPHOS 57  BILITOT 1.0  PROT 6.0*  ALBUMIN 2.9*   No results for input(s): LIPASE, AMYLASE in the last 168 hours. No results for input(s): AMMONIA in the last 168 hours. CBC: Recent Labs  Lab 08/09/17 1912 08/10/17 0600 08/11/17 0712 08/12/17 0753 08/13/17 0710  WBC 15.7* 15.3* 8.1 5.4 4.7  NEUTROABS 14.0* 13.2*  --   --   --   HGB 10.3* 10.0* 9.8* 9.8* 10.4*  HCT 31.6* 31.8* 30.8* 30.2* 30.6*  MCV 89.3 89.6 88.8 87.3 86.7  PLT 209 234 234 259 234    Cardiac Enzymes: No results for input(s): CKTOTAL, CKMB, CKMBINDEX, TROPONINI in the last 168 hours. BNP: BNP (last 3 results) No results for input(s): BNP in the last 8760 hours.  ProBNP (last 3 results) No results for input(s): PROBNP in the last 8760 hours.  CBG: No results for input(s): GLUCAP in the last 168 hours.     Signed:  Alma Friendly, MD [facility] 08/15/2017, 12:33 PM

## 2017-08-15 NOTE — Clinical Social Work Note (Signed)
Patient medically stable for discharge and [facility]. Discharge clinicals transmitted via HUB and CSW talked with Clydie BraunKaren, resident care Manager regarding d/c and her review of discharge clinicals. Call made to patient's son Loleta BooksSonny and informed him of discharge and ambulance transport. CSW signing off due to patient's discharge back to ALF.  Genelle BalVanessa Agape Hardiman, MSW, LCSW Licensed Clinical Social Worker Clinical Social Work Department [facility] [phone]

## 2017-08-15 NOTE — Progress Notes (Signed)
Attempted to call for report, no answer. [facility]ing for [facility].

## 2017-08-15 NOTE — NC FL2 (Addendum)
[place] MEDICAID FL2 LEVEL OF CARE SCREENING TOOL     IDENTIFICATION  Patient Name: Antonio Duran Birthdate: [birth year] Sex: male Admission Date (Current Location): 08/09/2017  [facility] and [place] Number:  Producer, television/film/video and Address:  [facility] [place], [address], [place], [place] [address]      Provider Number: [id number]  Attending Physician Name and Address:  Briant Cedar, MD  Relative Name and Phone Number:  Primus Gritton; [phone]    Current Level of Care: Hospital Recommended Level of Care: Assisted Living Facility(From [facility]) Prior Approval Number:    Date Approved/Denied:   PASRR Number: [id number](Eff. 01/22/16)  Discharge Plan: Other (Comment)([facility])    Current Diagnoses: Patient Active Problem List   Diagnosis Date Noted  . UTI (urinary tract infection) 08/10/2017  . HCAP (healthcare-associated pneumonia) 08/10/2017  . Sepsis, unspecified organism (HCC) 08/09/2017  . CKD (chronic kidney disease), stage III (HCC) 08/09/2017  . Sepsis (HCC) 08/09/2017  . Normocytic anemia   . Lung nodule, solitary   . Mixed dementia   . Palliative care by specialist   . Agitation   . Insomnia   . Enterococcus UTI 01/18/2016  . Confusion 01/18/2016  . Dementia with behavioral disturbance 01/18/2016    Orientation RESPIRATION BLADDER Height & Weight     Self  Normal Incontinent, External catheter Weight: 147 lb 11.3 oz (67 kg) Height:  5\' 9"  (175.3 cm)(10/21/16)  BEHAVIORAL SYMPTOMS/MOOD NEUROLOGICAL BOWEL NUTRITION STATUS  Other (Comment)(Pt is very confused and unable to redirect per nursing )   Incontinent, Continent Diet(Regular)  AMBULATORY STATUS COMMUNICATION OF NEEDS Skin   Limited Assist Verbally Other (Comment)(Ecchymosis right/left lower arm)                       Personal Care Assistance Level of Assistance  Bathing, Feeding, Dressing Bathing Assistance: Limited  assistance Feeding assistance: Limited assistance Dressing Assistance: Limited assistance     Functional Limitations Info  Sight, Hearing, Speech Sight Info: Adequate Hearing Info: Adequate Speech Info: Adequate    SPECIAL CARE FACTORS FREQUENCY  Speech therapy             Speech Therapy Frequency: Swallow evaluation 4/1 and determined to have mild aspiration risk. Thin liquids recommended      Contractures Contractures Info: Not present    Additional Factors Info  Code Status, Allergies Code Status Info: Full Allergies Info: Zolpidem           Current Medications (08/15/2017):  This is the current hospital active medication list Current Facility-Administered Medications  Medication Dose Route Frequency Provider Last Rate Last Dose  . acetaminophen (TYLENOL) tablet 650 mg  650 mg Oral Q6H PRN Opyd, Lavone Neri, MD       Or  . acetaminophen (TYLENOL) suppository 650 mg  650 mg Rectal Q6H PRN Opyd, Lavone Neri, MD      . bisacodyl (DULCOLAX) EC tablet 5 mg  5 mg Oral Daily PRN Opyd, Lavone Neri, MD      . cefTRIAXone (ROCEPHIN) 1 g in sodium chloride 0.9 % 100 mL IVPB  1 g Intravenous Q24H Rai, Ripudeep K, MD 200 mL/hr at 08/14/17 1428 1 g at 08/14/17 1428  . divalproex (DEPAKOTE SPRINKLE) capsule 250 mg  250 mg Oral TID Briscoe Deutscher, MD   250 mg at 08/15/17 1036  . heparin injection 5,000 Units  5,000 Units Subcutaneous Q8H Opyd, Lavone Neri, MD   5,000 Units at  08/14/17 2142  . HYDROcodone-acetaminophen (NORCO/VICODIN) 5-325 MG per tablet 1-2 tablet  1-2 tablet Oral Q4H PRN Opyd, Lavone Neriimothy S, MD      . LORazepam (ATIVAN) injection 0.5-1 mg  0.5-1 mg Intravenous Q4H PRN Opyd, Lavone Neriimothy S, MD   1 mg at 08/13/17 2343  . memantine (NAMENDA XR) 24 hr capsule 28 mg  28 mg Oral Daily Opyd, Lavone Neriimothy S, MD   28 mg at 08/15/17 1036  . OLANZapine (ZYPREXA) tablet 5 mg  5 mg Oral Q8H PRN Opyd, Lavone Neriimothy S, MD   5 mg at 08/14/17 2132  . ondansetron (ZOFRAN) tablet 4 mg  4 mg Oral Q6H PRN Opyd,  Lavone Neriimothy S, MD       Or  . ondansetron (ZOFRAN) injection 4 mg  4 mg Intravenous Q6H PRN Opyd, Lavone Neriimothy S, MD      . PARoxetine (PAXIL) tablet 20 mg  20 mg Oral QHS Opyd, Lavone Neriimothy S, MD   20 mg at 08/14/17 2132  . QUEtiapine (SEROQUEL) tablet 100 mg  100 mg Oral QHS Opyd, Lavone Neriimothy S, MD   100 mg at 08/14/17 2131  . senna-docusate (Senokot-S) tablet 1 tablet  1 tablet Oral QHS PRN Opyd, Lavone Neriimothy S, MD         Discharge Medications: Please see discharge summary for a list of discharge medications.  Relevant Imaging Results:  Relevant Lab Results:   Additional Information [id number]  DISCHARGE MEDICATIONS: STOP taking these medications   asenapine 5 MG Subl 24 hr tablet Commonly known as:  SAPHRIS     TAKE these medications   acetaminophen 500 MG tablet Commonly known as:  TYLENOL Take 500 mg by mouth every 4 (four) hours as needed for mild pain, moderate pain, fever or headache.   alum & mag hydroxide-simeth 200-200-20 MG/5ML suspension Commonly known as:  MAALOX/MYLANTA Take 30 mLs by mouth every 6 (six) hours as needed for indigestion or heartburn.   divalproex 125 MG capsule Commonly known as:  DEPAKOTE SPRINKLE Take 250 mg by mouth 3 (three) times daily.   estradiol 0.5 MG tablet Commonly known as:  ESTRACE Take 0.5 mg by mouth daily.   guaifenesin 100 MG/5ML syrup Commonly known as:  ROBITUSSIN Take 200 mg by mouth every 6 (six) hours as needed for cough.   loperamide 2 MG capsule Commonly known as:  IMODIUM Take 2 mg by mouth as needed for diarrhea or loose stools.   LORazepam 1 MG tablet Commonly known as:  ATIVAN Take 1 tablet (1 mg total) by mouth every 4 (four) hours as needed for anxiety or sedation.   magnesium hydroxide 400 MG/5ML suspension Commonly known as:  MILK OF MAGNESIA Take 30 mLs by mouth at bedtime as needed for mild constipation.   NAMENDA XR 28 MG Cp24 24 hr capsule Generic drug:  memantine Take 28 mg by mouth daily.    neomycin-bacitracin-polymyxin [phone] ointment Apply 1 application topically 4 (four) times daily.   OLANZapine 5 MG tablet Commonly known as:  ZYPREXA Take 1 tablet (5 mg total) by mouth every 8 (eight) hours as needed (for aggitation).   PARoxetine 20 MG tablet Commonly known as:  PAXIL Take 1 tablet (20 mg total) by mouth at bedtime.   QUEtiapine 25 MG tablet Commonly known as:  SEROQUEL Take 25 mg by mouth See admin instructions. Take 1 tablet (25 mg totally) by mouth every day at 2 PM   QUEtiapine 100 MG tablet Commonly known as:  SEROQUEL Take 1 tablet (100 mg total)  by mouth at bedtime.   traZODone 150 MG tablet Commonly known as:  DESYREL Take 75 mg by mouth at bedtime.       Cristobal Goldmann, LCSW

## 2017-10-02 ENCOUNTER — Other Ambulatory Visit: Payer: Self-pay | Admitting: Physician Assistant

## 2017-10-09 ENCOUNTER — Other Ambulatory Visit: Payer: Self-pay | Admitting: Physician Assistant

## 2017-10-09 DIAGNOSIS — R911 Solitary pulmonary nodule: Secondary | ICD-10-CM

## 2017-10-21 ENCOUNTER — Other Ambulatory Visit: Payer: Self-pay

## 2017-12-11 ENCOUNTER — Encounter (HOSPITAL_COMMUNITY): Payer: Self-pay | Admitting: Emergency Medicine

## 2017-12-11 ENCOUNTER — Emergency Department (HOSPITAL_COMMUNITY)
Admission: EM | Admit: 2017-12-11 | Discharge: 2017-12-12 | Disposition: A | Discharge status: Home / Self Care | Payer: Medicare HMO | Attending: Emergency Medicine | Admitting: Emergency Medicine

## 2017-12-11 ENCOUNTER — Emergency Department (HOSPITAL_COMMUNITY): Payer: Medicare HMO

## 2017-12-11 DIAGNOSIS — S098XXA Other specified injuries of head, initial encounter: Secondary | ICD-10-CM | POA: Diagnosis present

## 2017-12-11 DIAGNOSIS — Y939 Activity, unspecified: Secondary | ICD-10-CM | POA: Diagnosis not present

## 2017-12-11 DIAGNOSIS — Z87891 Personal history of nicotine dependence: Secondary | ICD-10-CM | POA: Diagnosis not present

## 2017-12-11 DIAGNOSIS — N183 Chronic kidney disease, stage 3 (moderate): Secondary | ICD-10-CM | POA: Diagnosis not present

## 2017-12-11 DIAGNOSIS — Z79899 Other long term (current) drug therapy: Secondary | ICD-10-CM | POA: Diagnosis not present

## 2017-12-11 DIAGNOSIS — Y999 Unspecified external cause status: Secondary | ICD-10-CM | POA: Insufficient documentation

## 2017-12-11 DIAGNOSIS — W1830XA Fall on same level, unspecified, initial encounter: Secondary | ICD-10-CM | POA: Diagnosis not present

## 2017-12-11 DIAGNOSIS — G309 Alzheimer's disease, unspecified: Secondary | ICD-10-CM | POA: Insufficient documentation

## 2017-12-11 DIAGNOSIS — F0281 Dementia in other diseases classified elsewhere with behavioral disturbance: Secondary | ICD-10-CM | POA: Diagnosis not present

## 2017-12-11 DIAGNOSIS — S0093XA Contusion of unspecified part of head, initial encounter: Secondary | ICD-10-CM | POA: Diagnosis not present

## 2017-12-11 DIAGNOSIS — Y92129 Unspecified place in nursing home as the place of occurrence of the external cause: Secondary | ICD-10-CM | POA: Insufficient documentation

## 2017-12-11 NOTE — ED Notes (Signed)
Bed: WA06 Expected date:  Expected time:  Means of arrival:  Comments: EMS 77 yo male fell and hit head/head lac-no blood thinners

## 2017-12-11 NOTE — ED Triage Notes (Signed)
Pt has dementia , fall unwitnessed one hr ago from [facility], v/s on arrival cbg 101, bp 114/78, hr 82, rr20, spo2 98 room air.  Lac to forehead, chin and skin tear to left finger. No loc, no blood thinners.

## 2017-12-11 NOTE — ED Notes (Signed)
Asked provider for use of possy belt, fall risk and dementia

## 2017-12-12 NOTE — ED Notes (Signed)
Called [facility] no answer  Called [facility]  Paperwork complete

## 2017-12-12 NOTE — Discharge Instructions (Addendum)
We saw you in the ER after you had a fall. °All the imaging results are normal, no fractures seen. No evidence of brain bleed. °Please be very careful with walking, and do everything possible to prevent falls. ° ° °

## 2017-12-12 NOTE — ED Provider Notes (Signed)
[facility]-EMERGENCY DEPT Provider Note   CSN: [id number] Arrival date & time: 12/11/17  2116     History   Chief Complaint Chief Complaint  Patient presents with  . Fall  . Laceration    forehead, chin and skin tear left finger - no loc no blood thinner - dementia     HPI Antonio Duran is a 77 y.o. male.  HPI 77 year old male comes in with chief complaint of fall and laceration. Level 5 caveat for dementia.  According to EMS note, patient had a fall prior to ED arrival.  This was an unwitnessed fall.  Patient is noted to have some mild bleeding from his scalp and his hand.  Patient has no complaints.  Past Medical History:  Diagnosis Date  . Alzheimer's dementia   . Enterococcus UTI     Patient Active Problem List   Diagnosis Date Noted  . UTI (urinary tract infection) 08/10/2017  . HCAP (healthcare-associated pneumonia) 08/10/2017  . Sepsis, unspecified organism (HCC) 08/09/2017  . CKD (chronic kidney disease), stage III (HCC) 08/09/2017  . Sepsis (HCC) 08/09/2017  . Normocytic anemia   . Lung nodule, solitary   . Mixed dementia   . Palliative care by specialist   . Agitation   . Insomnia   . Enterococcus UTI 01/18/2016  . Confusion 01/18/2016  . Dementia with behavioral disturbance 01/18/2016    History reviewed. No pertinent surgical history.      Home Medications    Prior to Admission medications   Medication Sig Start Date End Date Taking? Authorizing Provider  albuterol (PROVENTIL) (2.5 MG/3ML) 0.083% nebulizer solution Take 2.5 mg by nebulization every 6 (six) hours as needed for wheezing or shortness of breath.   Yes [provider]  bisacodyl (DULCOLAX) 10 MG suppository Place 10 mg rectally daily as needed for moderate constipation.   Yes [provider]  divalproex (DEPAKOTE SPRINKLE) 125 MG capsule Take 250 mg by mouth 3 (three) times daily.    Yes [provider]  estradiol (ESTRACE) 0.5 MG  tablet Take 0.5 mg by mouth daily.   Yes [provider]  LORazepam (ATIVAN) 1 MG tablet Take 1 tablet (1 mg total) by mouth every 4 (four) hours as needed for anxiety or sedation. 07/30/17  Yes Charm Rings, NP  Melatonin 5 MG TABS Take 1 tablet by mouth every evening.   Yes [provider]  memantine (NAMENDA XR) 28 MG CP24 24 hr capsule Take 28 mg by mouth daily.    Yes [provider]  Nutritional Supplements (NUTRITIONAL SHAKE PO) Take 237 mLs by mouth 3 (three) times daily.   Yes [provider]  PARoxetine (PAXIL) 20 MG tablet Take 1 tablet (20 mg total) by mouth at bedtime. 07/30/17  Yes Charm Rings, NP  QUEtiapine (SEROQUEL) 100 MG tablet Take 1 tablet (100 mg total) by mouth at bedtime. 07/30/17  Yes Charm Rings, NP  QUEtiapine (SEROQUEL) 25 MG tablet Take 25 mg by mouth See admin instructions. Take 1 tablet (25 mg totally) by mouth every day at 2 PM   Yes [provider]  sennosides-docusate sodium (SENOKOT-S) 8.6-50 MG tablet Take 2 tablets by mouth daily.   Yes [provider]  traZODone (DESYREL) 150 MG tablet Take 75 mg by mouth at bedtime. 08/04/17  Yes [provider]  triamcinolone cream (KENALOG) 0.5 % Apply 1 application topically 2 (two) times daily.   Yes [provider]  acetaminophen (TYLENOL) 500  MG tablet Take 500 mg by mouth every 4 (four) hours as needed for mild pain, moderate pain, fever or headache.     [provider]  alum & mag hydroxide-simeth (MAALOX/MYLANTA) 200-200-20 MG/5ML suspension Take 30 mLs by mouth every 6 (six) hours as needed for indigestion or heartburn.    [provider]  guaifenesin (ROBITUSSIN) 100 MG/5ML syrup Take 200 mg by mouth every 6 (six) hours as needed for cough.    [provider]  loperamide (IMODIUM) 2 MG capsule Take 2 mg by mouth as needed for diarrhea or loose stools.    [provider]  magnesium hydroxide (MILK OF  MAGNESIA) 400 MG/5ML suspension Take 30 mLs by mouth at bedtime as needed for mild constipation.    [provider]  neomycin-bacitracin-polymyxin (NEOSPORIN) [phone] ointment Apply 1 application topically 4 (four) times daily as needed (skin tears).     [provider]  OLANZapine (ZYPREXA) 5 MG tablet Take 1 tablet (5 mg total) by mouth every 8 (eight) hours as needed (for aggitation). 07/30/17   Gwyneth Sprout, MD    Family History Family History  Problem Relation Age of Onset  . Dementia Sister     Social History Social History   Tobacco Use  . Smoking status: Former Games developer  . Smokeless tobacco: Never Used  Substance Use Topics  . Alcohol use: No  . Drug use: No     Allergies   Zolpidem   Review of Systems Review of Systems  Unable to perform ROS: Dementia     Physical Exam Updated Vital Signs BP (!) 159/102 (BP Location: Right Arm)   Pulse 72   Temp 98.2 F (36.8 C) (Oral)   Resp 16   SpO2 97%   Physical Exam  Constitutional: He is oriented to person, place, and time. He appears well-developed.  HENT:  Head: Atraumatic.  Neck: Neck supple.  Cardiovascular: Normal rate.  Pulmonary/Chest: Effort normal.  Musculoskeletal:  Head to toe evaluation shows no hematoma, bleeding of the scalp, no facial abrasions, no spine step offs, crepitus of the chest or neck, no tenderness to palpation of the bilateral upper and lower extremities, no gross deformities, no chest tenderness, no pelvic pain.   Neurological: He is alert and oriented to person, place, and time.  Skin: Skin is warm.  2 superficial abrasions noted, one over the forehead and one over the left hand.  Nursing note and vitals reviewed.    ED Treatments / Results  Labs (all labs ordered are listed, but only abnormal results are displayed) Labs Reviewed - No data to display  EKG None  Radiology Ct Head Wo Contrast  Result Date: 12/12/2017 CLINICAL DATA:  Patient had an  unwitnessed fall 1 hour ago. Dementia. EXAM: CT HEAD WITHOUT CONTRAST CT CERVICAL SPINE WITHOUT CONTRAST TECHNIQUE: Multidetector CT imaging of the head and cervical spine was performed following the standard protocol without intravenous contrast. Multiplanar CT image reconstructions of the cervical spine were also generated. COMPARISON:  10/18/2016 FINDINGS: CT HEAD FINDINGS Brain: Redemonstration of sulcal and ventricular prominence consistent with atrophy. There is a moderate to marked degree of chronic supratentorial small-vessel ischemia. No acute intracranial hemorrhage, midline shift or edema. No large vascular territory infarct. No intra-axial mass nor extra-axial fluid collections. Vascular: Hyperdense vessel sign. Moderate atherosclerosis at the skull base. Skull: No acute displaced calvarial fracture. Mild left frontal scalp soft tissue swelling. Sinuses/Orbits: No acute finding. Other: None. CT CERVICAL SPINE FINDINGS Alignment: There is marked motion  related artifacts limiting assessment of the C1-C2 relationship. Would additional plain radiographic imaging of the cervical spine to exclude a fracture at the skull base and involving the C1-C2 vertebrae. Skull base and vertebrae: No fracture of the included base of C2 through T3 vertebral bodies. Soft tissues and spinal canal: No prevertebral soft tissue swelling. No visible canal hematoma. Disc levels: Cervical spondylosis with marked disc space narrowing C3 through C7. Associated uncovertebral joint osteoarthritis bilaterally contributing to mild mild-to-moderate foraminal encroachment more marked at C5-6 and C6-7 bilaterally. Upper chest: Tiny nodular opacities are noted in the upper lobes bilaterally that may reflect postinfectious or inflammatory change or potentially stigmata of bronchiolitis. Other: None IMPRESSION: Imaging of the brain and cervical spine were limited due to patient motion artifacts. Best images were provided. There is  redemonstration of cerebral atrophy with moderate to marked degree of small vessel ischemia. No acute intracranial abnormality. Limited assessment of C1-C2 due to motion artifacts. Plain radiographic correlation may help reassure that there is no fracture. The base of C2 through T3 demonstrate no acute fracture. Cervical spondylosis is redemonstrated. Tiny tree-in-bud opacities as well as tiny acinar opacities are seen in the upper lobes bilaterally that may reflect bronchiolitis and/or other stigmata of infection/inflammation. Electronically Signed   By: Tollie Ethavid  Kwon M.D.   On: 12/12/2017 00:53   Ct Cervical Spine Wo Contrast  Result Date: 12/12/2017 CLINICAL DATA:  Patient had an unwitnessed fall 1 hour ago. Dementia. EXAM: CT HEAD WITHOUT CONTRAST CT CERVICAL SPINE WITHOUT CONTRAST TECHNIQUE: Multidetector CT imaging of the head and cervical spine was performed following the standard protocol without intravenous contrast. Multiplanar CT image reconstructions of the cervical spine were also generated. COMPARISON:  10/18/2016 FINDINGS: CT HEAD FINDINGS Brain: Redemonstration of sulcal and ventricular prominence consistent with atrophy. There is a moderate to marked degree of chronic supratentorial small-vessel ischemia. No acute intracranial hemorrhage, midline shift or edema. No large vascular territory infarct. No intra-axial mass nor extra-axial fluid collections. Vascular: Hyperdense vessel sign. Moderate atherosclerosis at the skull base. Skull: No acute displaced calvarial fracture. Mild left frontal scalp soft tissue swelling. Sinuses/Orbits: No acute finding. Other: None. CT CERVICAL SPINE FINDINGS Alignment: There is marked motion related artifacts limiting assessment of the C1-C2 relationship. Would additional plain radiographic imaging of the cervical spine to exclude a fracture at the skull base and involving the C1-C2 vertebrae. Skull base and vertebrae: No fracture of the included base of C2 through  T3 vertebral bodies. Soft tissues and spinal canal: No prevertebral soft tissue swelling. No visible canal hematoma. Disc levels: Cervical spondylosis with marked disc space narrowing C3 through C7. Associated uncovertebral joint osteoarthritis bilaterally contributing to mild mild-to-moderate foraminal encroachment more marked at C5-6 and C6-7 bilaterally. Upper chest: Tiny nodular opacities are noted in the upper lobes bilaterally that may reflect postinfectious or inflammatory change or potentially stigmata of bronchiolitis. Other: None IMPRESSION: Imaging of the brain and cervical spine were limited due to patient motion artifacts. Best images were provided. There is redemonstration of cerebral atrophy with moderate to marked degree of small vessel ischemia. No acute intracranial abnormality. Limited assessment of C1-C2 due to motion artifacts. Plain radiographic correlation may help reassure that there is no fracture. The base of C2 through T3 demonstrate no acute fracture. Cervical spondylosis is redemonstrated. Tiny tree-in-bud opacities as well as tiny acinar opacities are seen in the upper lobes bilaterally that may reflect bronchiolitis and/or other stigmata of infection/inflammation. Electronically Signed   By: Rene Kocheravid  Kwon M.D.  On: 12/12/2017 00:53    Procedures Procedures (including critical care time)  Medications Ordered in ED Medications - No data to display   Initial Impression / Assessment and Plan / ED Course  I have reviewed the triage vital signs and the nursing notes.  Pertinent labs & imaging results that were available during my care of the patient were reviewed by me and considered in my medical decision making (see chart for details).     DDx includes: - Mechanical falls - ICH - Fractures - Contusions - Soft tissue injury  Patient comes in after unwitnessed fall.  Patient is demented, unable to provide any meaningful history.  CT head and C-spine ordered because we  cannot clear them clinically.  Rest of the body exam does not reveal any deformities or active bleeding.  Patient has 2 small abrasions that will not need any repair in the ED.  Final Clinical Impressions(s) / ED Diagnoses   Final diagnoses:  Contusion of head, unspecified part of head, initial encounter    ED Discharge Orders    None       Derwood Kaplan, MD 12/12/17 0330

## 2018-01-01 ENCOUNTER — Emergency Department (HOSPITAL_COMMUNITY)
Admission: EM | Admit: 2018-01-01 | Discharge: 2018-01-01 | Disposition: A | Discharge status: Home / Self Care | Payer: Medicare HMO | Attending: Emergency Medicine | Admitting: Emergency Medicine

## 2018-01-01 ENCOUNTER — Encounter (HOSPITAL_COMMUNITY): Payer: Self-pay | Admitting: *Deleted

## 2018-01-01 ENCOUNTER — Emergency Department (HOSPITAL_COMMUNITY): Payer: Medicare HMO

## 2018-01-01 ENCOUNTER — Other Ambulatory Visit: Payer: Self-pay

## 2018-01-01 DIAGNOSIS — Z87891 Personal history of nicotine dependence: Secondary | ICD-10-CM | POA: Diagnosis not present

## 2018-01-01 DIAGNOSIS — W19XXXA Unspecified fall, initial encounter: Secondary | ICD-10-CM | POA: Insufficient documentation

## 2018-01-01 DIAGNOSIS — Y999 Unspecified external cause status: Secondary | ICD-10-CM | POA: Insufficient documentation

## 2018-01-01 DIAGNOSIS — Z043 Encounter for examination and observation following other accident: Secondary | ICD-10-CM | POA: Insufficient documentation

## 2018-01-01 DIAGNOSIS — Z79899 Other long term (current) drug therapy: Secondary | ICD-10-CM | POA: Insufficient documentation

## 2018-01-01 DIAGNOSIS — Y939 Activity, unspecified: Secondary | ICD-10-CM | POA: Diagnosis not present

## 2018-01-01 DIAGNOSIS — Y92129 Unspecified place in nursing home as the place of occurrence of the external cause: Secondary | ICD-10-CM | POA: Diagnosis not present

## 2018-01-01 DIAGNOSIS — F0280 Dementia in other diseases classified elsewhere without behavioral disturbance: Secondary | ICD-10-CM | POA: Insufficient documentation

## 2018-01-01 DIAGNOSIS — G309 Alzheimer's disease, unspecified: Secondary | ICD-10-CM | POA: Insufficient documentation

## 2018-01-01 DIAGNOSIS — N183 Chronic kidney disease, stage 3 (moderate): Secondary | ICD-10-CM | POA: Insufficient documentation

## 2018-01-01 NOTE — ED Notes (Signed)
Bed: [facility] Expected date:  Expected time:  Means of arrival:  Comments: EMS fall 

## 2018-01-01 NOTE — ED Notes (Signed)
Patient transported to CT 

## 2018-01-01 NOTE — ED Notes (Signed)
Bed: [facility]PT4 Expected date:  Expected time:  Means of arrival:  Comments: 

## 2018-01-01 NOTE — ED Provider Notes (Signed)
[facility]-EMERGENCY DEPT Provider Note   CSN: [id number] Arrival date & time: 01/01/18  1502     History   Chief Complaint Chief Complaint  Patient presents with  . Near Syncope    HPI Georgia DomCharlie Gomer is a 77 y.o. male.  Patient with history of dementia, overall pleasant.  Does not have any complaints.  Patient at baseline per nursing home.  Patient had fall last night and were concerned this morning and sent for evaluation.  Patient denies any pain.  Moves all of his extremities.  He is not on any blood thinners.  The history is provided by the patient and the EMS personnel.  Illness  This is a new problem. The current episode started yesterday. The problem occurs rarely. The problem has been resolved. Pertinent negatives include no chest pain, no abdominal pain, no headaches and no shortness of breath. Nothing aggravates the symptoms. Nothing relieves the symptoms. He has tried nothing for the symptoms. The treatment provided no relief.    Past Medical History:  Diagnosis Date  . Alzheimer's dementia   . Enterococcus UTI     Patient Active Problem List   Diagnosis Date Noted  . UTI (urinary tract infection) 08/10/2017  . HCAP (healthcare-associated pneumonia) 08/10/2017  . Sepsis, unspecified organism (HCC) 08/09/2017  . CKD (chronic kidney disease), stage III (HCC) 08/09/2017  . Sepsis (HCC) 08/09/2017  . Normocytic anemia   . Lung nodule, solitary   . Mixed dementia   . Palliative care by specialist   . Agitation   . Insomnia   . Enterococcus UTI 01/18/2016  . Confusion 01/18/2016  . Dementia with behavioral disturbance 01/18/2016    History reviewed. No pertinent surgical history.      Home Medications    Prior to Admission medications   Medication Sig Start Date End Date Taking? Authorizing Provider  acetaminophen (TYLENOL) 500 MG tablet Take 500 mg by mouth every 4 (four) hours as needed for mild pain, moderate pain, fever or  headache.    Yes [provider]  albuterol (PROVENTIL) (2.5 MG/3ML) 0.083% nebulizer solution Take 2.5 mg by nebulization every 6 (six) hours as needed for wheezing or shortness of breath.   Yes [provider]  alum & mag hydroxide-simeth (MAALOX/MYLANTA) 200-200-20 MG/5ML suspension Take 30 mLs by mouth every 6 (six) hours as needed for indigestion or heartburn.   Yes [provider]  bisacodyl (DULCOLAX) 10 MG suppository Place 10 mg rectally daily as needed for moderate constipation.   Yes [provider]  divalproex (DEPAKOTE SPRINKLE) 125 MG capsule Take 250 mg by mouth 3 (three) times daily.    Yes [provider]  estradiol (ESTRACE) 0.5 MG tablet Take 0.5 mg by mouth daily.   Yes [provider]  guaifenesin (ROBITUSSIN) 100 MG/5ML syrup Take 200 mg by mouth every 6 (six) hours as needed for cough.   Yes [provider]  loperamide (IMODIUM) 2 MG capsule Take 2 mg by mouth as needed for diarrhea or loose stools.   Yes [provider]  LORazepam (ATIVAN) 1 MG tablet Take 1 tablet (1 mg total) by mouth every 4 (four) hours as needed for anxiety or sedation. 07/30/17  Yes Charm RingsLord, Jamison Y, NP  magnesium hydroxide (MILK OF MAGNESIA) 400 MG/5ML suspension Take 30 mLs by mouth at bedtime as needed for mild constipation.   Yes [provider]  Melatonin 5 MG TABS Take 1 tablet by mouth every evening.   Yes [provider]  memantine (NAMENDA XR) 28 MG CP24 24 hr capsule Take 28 mg by mouth daily.    Yes [provider]  neomycin-bacitracin-polymyxin (NEOSPORIN) [phone] ointment Apply 1 application topically 4 (four) times daily as needed (skin tears).    Yes [provider]  Nutritional Supplements (NUTRITIONAL SHAKE PO) Take 237 mLs by mouth 3 (three) times daily.   Yes [provider]  OLANZapine (ZYPREXA) 5 MG tablet Take 1 tablet (5 mg total) by mouth every 8 (eight) hours as  needed (for aggitation). 07/30/17  Yes Plunkett, Alphonzo Lemmings, MD  PARoxetine (PAXIL) 20 MG tablet Take 1 tablet (20 mg total) by mouth at bedtime. 07/30/17  Yes Charm Rings, NP  QUEtiapine (SEROQUEL) 100 MG tablet Take 1 tablet (100 mg total) by mouth at bedtime. 07/30/17  Yes Charm Rings, NP  QUEtiapine (SEROQUEL) 25 MG tablet Take 25-100 mg by mouth See admin instructions. Take 1 tablet (25 mg totally) by mouth every day at 2 PM.   Yes [provider]  sennosides-docusate sodium (SENOKOT-S) 8.6-50 MG tablet Take 2 tablets by mouth daily.   Yes [provider]  traZODone (DESYREL) 150 MG tablet Take 75 mg by mouth at bedtime. 08/04/17  Yes [provider]  triamcinolone cream (KENALOG) 0.5 % Apply 1 application topically 2 (two) times daily.   Yes [provider]    Family History Family History  Problem Relation Age of Onset  . Dementia Sister     Social History Social History   Tobacco Use  . Smoking status: Former Games developer  . Smokeless tobacco: Never Used  Substance Use Topics  . Alcohol use: No  . Drug use: No     Allergies   Zolpidem   Review of Systems Review of Systems  Constitutional: Negative for chills and fever.  HENT: Negative for ear pain and sore throat.   Eyes: Negative for pain and visual disturbance.  Respiratory: Negative for cough and shortness of breath.   Cardiovascular: Negative for chest pain and palpitations.  Gastrointestinal: Negative for abdominal pain and vomiting.  Genitourinary: Negative for dysuria and hematuria.  Musculoskeletal: Negative for arthralgias and back pain.  Skin: Negative for color change and rash.  Neurological: Negative for seizures, syncope and headaches.  All other systems reviewed and are negative.    Physical Exam Updated Vital Signs  ED Triage Vitals [01/01/18 1514]  Enc Vitals Group     BP (!) 152/92     Pulse Rate 73     Resp 18     Temp 98.3 F (36.8 C)     Temp Source  Oral     SpO2 100 %     Weight      Height      Head Circumference      Peak Flow      Pain Score      Pain Loc      Pain Edu?      Excl. in GC?     Physical Exam  Constitutional: He is oriented to person, place, and time. He appears well-developed and well-nourished.  HENT:  Head: Normocephalic and atraumatic.  Eyes: Pupils are equal, round, and reactive to light. Conjunctivae and EOM are normal.  Neck: Neck supple.  Cardiovascular: Normal rate, regular rhythm, normal heart sounds and intact distal pulses.  No murmur heard. Pulmonary/Chest: Effort normal and breath sounds normal. No respiratory distress.  Abdominal: Soft. He exhibits no distension. There is no tenderness.  Musculoskeletal:  He exhibits no edema.  Neurological: He is alert and oriented to person, place, and time. No cranial nerve deficit or sensory deficit. He exhibits normal muscle tone. Coordination normal.  Moves all extremities, normal sensation, awake and alert and pleasant  Skin: Skin is warm and dry.  Psychiatric: He has a normal mood and affect.  Nursing note and vitals reviewed.    ED Treatments / Results  Labs (all labs ordered are listed, but only abnormal results are displayed) Labs Reviewed - No data to display  EKG EKG Interpretation  Date/Time:  Thursday January 01 2018 16:55:31 EDT Ventricular Rate:  68 PR Interval:    QRS Duration: 93 QT Interval:  437 QTC Calculation: 465 R Axis:   -53 Text Interpretation:  Sinus rhythm Left axis deviation Borderline low voltage, extremity leads Abnormal R-wave progression, early transition Baseline wander in lead(s) V3 V5 V6 Confirmed by Virgina Norfolk [phone]) on 01/01/2018 5:09:30 PM   Radiology Ct Head Wo Contrast  Result Date: 01/01/2018 CLINICAL DATA:  Recent fall, unsteady gait EXAM: CT HEAD WITHOUT CONTRAST TECHNIQUE: Contiguous axial images were obtained from the base of the skull through the vertex without intravenous contrast. COMPARISON:   CT brain scan of 12/11/2016 FINDINGS: Brain: The ventricular system remains diffusely prominent as are the cortical sulci, consistent with diffuse atrophy. The septum is midline in position. Moderate small vessel ischemic changes again noted throughout the periventricular white matter. No hemorrhage, mass lesion, or acute infarction is seen. Vascular: No vascular abnormality is seen on this unenhanced study. Skull: On bone window images, no calvarial abnormality is seen. Sinuses/Orbits: The paranasal sinuses are well pneumatized. Other: None. IMPRESSION: Diffuse atrophy and small vessel disease. No acute intracranial abnormality Electronically Signed   By: Dwyane Dee M.D.   On: 01/01/2018 17:06    Procedures Procedures (including critical care time)  Medications Ordered in ED Medications - No data to display   Initial Impression / Assessment and Plan / ED Course  I have reviewed the triage vital signs and the nursing notes.  Pertinent labs & imaging results that were available during my care of the patient were reviewed by me and considered in my medical decision making (see chart for details).     Armand Preast is a 77 year old male with history of dementia who presents to the ED after a fall last night.  Patient with normal vitals.  No fever.  Patient had a mechanical fall last night.  Patient was not transported last night but brought for evaluation after further discussion with family today.  EKG shows sinus rhythm with no signs of ischemic changes.  Patient denies any pain.  Is overall well-appearing.  He appears to be at his baseline.  No tenderness anywhere.  CT of the head showed no acute findings.  Patient had no midline spinal pain on exam.  Patient discharged from ED in good condition.  Final Clinical Impressions(s) / ED Diagnoses   Final diagnoses:  Fall, initial encounter    ED Discharge Orders    None       Virgina Norfolk, DO 01/01/18 1807

## 2018-01-01 NOTE — ED Triage Notes (Signed)
Pt with fall on 8/21 however pt's son refused for pt to be transported to ED at that time to be evaluated. Today pt was stumbling and nearly fell so staff of SNF called EMS and son agreed to have pt transported today for evaluation. Pt is at baseline per staff with history of dementia a&o to self only.

## 2018-01-01 NOTE — ED Notes (Signed)
Pt with episode of incontinence. Pt cleaned, changed into paper scrubs and personal belongings (soiled clothing) placed in pt belongings bag. [facility] and [facility] called at this time

## 2018-02-01 ENCOUNTER — Observation Stay (HOSPITAL_COMMUNITY)
Admission: EM | Admit: 2018-02-01 | Discharge: 2018-02-03 | Disposition: A | Discharge status: Home / Self Care | Payer: Medicare HMO | Attending: Internal Medicine | Admitting: Internal Medicine

## 2018-02-01 ENCOUNTER — Encounter (HOSPITAL_COMMUNITY): Payer: Self-pay | Admitting: Emergency Medicine

## 2018-02-01 ENCOUNTER — Emergency Department (HOSPITAL_COMMUNITY): Payer: Medicare HMO

## 2018-02-01 ENCOUNTER — Other Ambulatory Visit: Payer: Self-pay

## 2018-02-01 DIAGNOSIS — Z87891 Personal history of nicotine dependence: Secondary | ICD-10-CM | POA: Insufficient documentation

## 2018-02-01 DIAGNOSIS — N183 Chronic kidney disease, stage 3 unspecified: Secondary | ICD-10-CM | POA: Diagnosis present

## 2018-02-01 DIAGNOSIS — G9340 Encephalopathy, unspecified: Secondary | ICD-10-CM | POA: Diagnosis present

## 2018-02-01 DIAGNOSIS — R296 Repeated falls: Principal | ICD-10-CM | POA: Insufficient documentation

## 2018-02-01 DIAGNOSIS — F418 Other specified anxiety disorders: Secondary | ICD-10-CM | POA: Diagnosis not present

## 2018-02-01 DIAGNOSIS — Z87440 Personal history of urinary (tract) infections: Secondary | ICD-10-CM | POA: Diagnosis not present

## 2018-02-01 DIAGNOSIS — D631 Anemia in chronic kidney disease: Secondary | ICD-10-CM | POA: Diagnosis not present

## 2018-02-01 DIAGNOSIS — F0281 Dementia in other diseases classified elsewhere with behavioral disturbance: Secondary | ICD-10-CM | POA: Diagnosis not present

## 2018-02-01 DIAGNOSIS — W19XXXA Unspecified fall, initial encounter: Secondary | ICD-10-CM

## 2018-02-01 DIAGNOSIS — D649 Anemia, unspecified: Secondary | ICD-10-CM | POA: Diagnosis present

## 2018-02-01 DIAGNOSIS — Z8673 Personal history of transient ischemic attack (TIA), and cerebral infarction without residual deficits: Secondary | ICD-10-CM | POA: Insufficient documentation

## 2018-02-01 DIAGNOSIS — G4700 Insomnia, unspecified: Secondary | ICD-10-CM | POA: Diagnosis not present

## 2018-02-01 DIAGNOSIS — R29818 Other symptoms and signs involving the nervous system: Secondary | ICD-10-CM | POA: Diagnosis present

## 2018-02-01 DIAGNOSIS — G309 Alzheimer's disease, unspecified: Secondary | ICD-10-CM | POA: Diagnosis not present

## 2018-02-01 DIAGNOSIS — F0391 Unspecified dementia with behavioral disturbance: Secondary | ICD-10-CM | POA: Diagnosis present

## 2018-02-01 DIAGNOSIS — Z79899 Other long term (current) drug therapy: Secondary | ICD-10-CM | POA: Insufficient documentation

## 2018-02-01 DIAGNOSIS — N179 Acute kidney failure, unspecified: Secondary | ICD-10-CM | POA: Insufficient documentation

## 2018-02-01 DIAGNOSIS — E876 Hypokalemia: Secondary | ICD-10-CM

## 2018-02-01 DIAGNOSIS — F03918 Unspecified dementia, unspecified severity, with other behavioral disturbance: Secondary | ICD-10-CM | POA: Diagnosis present

## 2018-02-01 HISTORY — DX: Acute kidney failure, unspecified: N17.9

## 2018-02-01 HISTORY — DX: Unspecified fall, initial encounter: W19.XXXA

## 2018-02-01 HISTORY — DX: Anemia, unspecified: D64.9

## 2018-02-01 LAB — COMPREHENSIVE METABOLIC PANEL
ALT: 12 U/L (ref 0–44)
ANION GAP: 9 (ref 5–15)
AST: 14 U/L — ABNORMAL LOW (ref 15–41)
Albumin: 2.6 g/dL — ABNORMAL LOW (ref 3.5–5.0)
Alkaline Phosphatase: 50 U/L (ref 38–126)
BUN: 24 mg/dL — ABNORMAL HIGH (ref 8–23)
CHLORIDE: 115 mmol/L — AB (ref 98–111)
CO2: 20 mmol/L — AB (ref 22–32)
Calcium: 8 mg/dL — ABNORMAL LOW (ref 8.9–10.3)
Creatinine, Ser: 1.36 mg/dL — ABNORMAL HIGH (ref 0.61–1.24)
GFR calc non Af Amer: 49 mL/min — ABNORMAL LOW (ref >60)
GFR, EST AFRICAN AMERICAN: 57 mL/min — AB (ref >60)
Glucose, Bld: 115 mg/dL — ABNORMAL HIGH (ref 70–99)
POTASSIUM: 3.7 mmol/L (ref 3.5–5.1)
SODIUM: 144 mmol/L (ref 135–145)
Total Bilirubin: 0.3 mg/dL (ref 0.3–1.2)
Total Protein: 5.4 g/dL — ABNORMAL LOW (ref 6.5–8.1)

## 2018-02-01 LAB — CBC WITH DIFFERENTIAL/PLATELET
Abs Immature Granulocytes: 0 10*3/uL (ref 0.0–0.1)
Basophils Absolute: 0 10*3/uL (ref 0.0–0.1)
Basophils Relative: 1 %
EOS PCT: 0 %
Eosinophils Absolute: 0 10*3/uL (ref 0.0–0.7)
HCT: 29 % — ABNORMAL LOW (ref 39.0–52.0)
HEMOGLOBIN: 8.9 g/dL — AB (ref 13.0–17.0)
Immature Granulocytes: 1 %
LYMPHS ABS: 1.4 10*3/uL (ref 0.7–4.0)
LYMPHS PCT: 31 %
MCH: 29.2 pg (ref 26.0–34.0)
MCHC: 30.7 g/dL (ref 30.0–36.0)
MCV: 95.1 fL (ref 78.0–100.0)
MONOS PCT: 10 %
Monocytes Absolute: 0.5 10*3/uL (ref 0.1–1.0)
NEUTROS ABS: 2.7 10*3/uL (ref 1.7–7.7)
NEUTROS PCT: 57 %
Platelets: 184 10*3/uL (ref 150–400)
RBC: 3.05 MIL/uL — AB (ref 4.22–5.81)
RDW: 14.6 % (ref 11.5–15.5)
WBC: 4.6 10*3/uL (ref 4.0–10.5)

## 2018-02-01 LAB — I-STAT CG4 LACTIC ACID, ED: LACTIC ACID, VENOUS: 1.67 mmol/L (ref 0.5–1.9)

## 2018-02-01 MED ORDER — LORAZEPAM 2 MG/ML IJ SOLN
0.5000 mg | Freq: Once | INTRAMUSCULAR | Status: AC
  Administered 2018-02-01: 0.5 mg via INTRAVENOUS
  Filled 2018-02-01: qty 1

## 2018-02-01 NOTE — ED Triage Notes (Signed)
[facility] with reports of 3 falls today within 30 mins. Staff reports normal mental status, hx of dementia. Skin tear to right elbow. C-collar in place. 124/62 laying, BP dropped to 98/58 when standing. 300 cc NS given by EMS

## 2018-02-01 NOTE — ED Notes (Addendum)
Pt resting comfortably at this time.

## 2018-02-02 ENCOUNTER — Emergency Department (HOSPITAL_COMMUNITY): Payer: Medicare HMO

## 2018-02-02 ENCOUNTER — Encounter (HOSPITAL_COMMUNITY): Payer: Self-pay | Admitting: Internal Medicine

## 2018-02-02 DIAGNOSIS — N179 Acute kidney failure, unspecified: Secondary | ICD-10-CM | POA: Diagnosis not present

## 2018-02-02 DIAGNOSIS — F418 Other specified anxiety disorders: Secondary | ICD-10-CM | POA: Diagnosis not present

## 2018-02-02 DIAGNOSIS — W19XXXA Unspecified fall, initial encounter: Secondary | ICD-10-CM

## 2018-02-02 DIAGNOSIS — E876 Hypokalemia: Secondary | ICD-10-CM

## 2018-02-02 DIAGNOSIS — N183 Chronic kidney disease, stage 3 (moderate): Secondary | ICD-10-CM

## 2018-02-02 DIAGNOSIS — D649 Anemia, unspecified: Secondary | ICD-10-CM

## 2018-02-02 DIAGNOSIS — F0391 Unspecified dementia with behavioral disturbance: Secondary | ICD-10-CM

## 2018-02-02 LAB — RETICULOCYTES
RBC.: 3.45 MIL/uL — ABNORMAL LOW (ref 4.22–5.81)
RETIC COUNT ABSOLUTE: 34.5 10*3/uL (ref 19.0–186.0)
RETIC CT PCT: 1 % (ref 0.4–3.1)

## 2018-02-02 LAB — IRON AND TIBC
IRON: 122 ug/dL (ref 45–182)
Saturation Ratios: 41 % — ABNORMAL HIGH (ref 17.9–39.5)
TIBC: 300 ug/dL (ref 250–450)
UIBC: 178 ug/dL

## 2018-02-02 LAB — BASIC METABOLIC PANEL
ANION GAP: 3 — AB (ref 5–15)
BUN: 12 mg/dL (ref 8–23)
CALCIUM: 6.6 mg/dL — AB (ref 8.9–10.3)
CO2: 20 mmol/L — ABNORMAL LOW (ref 22–32)
Chloride: 122 mmol/L — ABNORMAL HIGH (ref 98–111)
Creatinine, Ser: 0.8 mg/dL (ref 0.61–1.24)
GLUCOSE: 73 mg/dL (ref 70–99)
Potassium: 3.1 mmol/L — ABNORMAL LOW (ref 3.5–5.1)
Sodium: 145 mmol/L (ref 135–145)

## 2018-02-02 LAB — CBC
HCT: 29.1 % — ABNORMAL LOW (ref 39.0–52.0)
HEMOGLOBIN: 9 g/dL — AB (ref 13.0–17.0)
MCH: 29.4 pg (ref 26.0–34.0)
MCHC: 30.9 g/dL (ref 30.0–36.0)
MCV: 95.1 fL (ref 78.0–100.0)
PLATELETS: 181 10*3/uL (ref 150–400)
RBC: 3.06 MIL/uL — ABNORMAL LOW (ref 4.22–5.81)
RDW: 14.6 % (ref 11.5–15.5)
WBC: 4.2 10*3/uL (ref 4.0–10.5)

## 2018-02-02 LAB — URINALYSIS, ROUTINE W REFLEX MICROSCOPIC
BACTERIA UA: NONE SEEN
Bilirubin Urine: NEGATIVE
GLUCOSE, UA: NEGATIVE mg/dL
Ketones, ur: NEGATIVE mg/dL
LEUKOCYTES UA: NEGATIVE
NITRITE: NEGATIVE
Protein, ur: NEGATIVE mg/dL
RBC / HPF: >50 RBC/hpf — ABNORMAL HIGH (ref 0–5)
Specific Gravity, Urine: 1.015 (ref 1.005–1.030)
pH: 6 (ref 5.0–8.0)

## 2018-02-02 LAB — FOLATE: Folate: 11.3 ng/mL (ref >5.9)

## 2018-02-02 LAB — MAGNESIUM: Magnesium: 1.5 mg/dL — ABNORMAL LOW (ref 1.7–2.4)

## 2018-02-02 LAB — FERRITIN: Ferritin: 29 ng/mL (ref 24–336)

## 2018-02-02 LAB — MRSA PCR SCREENING: MRSA BY PCR: NEGATIVE

## 2018-02-02 LAB — VITAMIN B12: VITAMIN B 12: 170 pg/mL — AB (ref 180–914)

## 2018-02-02 MED ORDER — ONDANSETRON HCL 4 MG PO TABS: 4.0000 mg | ORAL_TABLET | Freq: Four times a day (QID) | ORAL | Status: DC | PRN

## 2018-02-02 MED ORDER — LORAZEPAM 1 MG PO TABS
1.0000 mg | ORAL_TABLET | ORAL | Status: DC | PRN
  Administered 2018-02-02: 1 mg via ORAL
  Filled 2018-02-02: qty 1

## 2018-02-02 MED ORDER — ALUM & MAG HYDROXIDE-SIMETH 200-200-20 MG/5ML PO SUSP: 30.0000 mL | Freq: Four times a day (QID) | ORAL | Status: DC | PRN

## 2018-02-02 MED ORDER — DIVALPROEX SODIUM 125 MG PO CSDR
250.0000 mg | DELAYED_RELEASE_CAPSULE | Freq: Three times a day (TID) | ORAL | Status: DC
  Administered 2018-02-02 – 2018-02-03 (×4): 250 mg via ORAL
  Filled 2018-02-02 (×6): qty 2

## 2018-02-02 MED ORDER — SENNOSIDES-DOCUSATE SODIUM 8.6-50 MG PO TABS
2.0000 | ORAL_TABLET | Freq: Every day | ORAL | Status: DC
  Administered 2018-02-02 – 2018-02-03 (×2): 2 via ORAL
  Filled 2018-02-02 (×2): qty 2

## 2018-02-02 MED ORDER — ALBUTEROL SULFATE (2.5 MG/3ML) 0.083% IN NEBU: 2.5000 mg | INHALATION_SOLUTION | RESPIRATORY_TRACT | Status: DC | PRN

## 2018-02-02 MED ORDER — HYDRALAZINE HCL 20 MG/ML IJ SOLN: 5.0000 mg | INTRAMUSCULAR | Status: DC | PRN

## 2018-02-02 MED ORDER — ESTRADIOL 1 MG PO TABS
0.5000 mg | ORAL_TABLET | Freq: Every day | ORAL | Status: DC
  Administered 2018-02-02 – 2018-02-03 (×2): 0.5 mg via ORAL
  Filled 2018-02-02 (×3): qty 0.5

## 2018-02-02 MED ORDER — ENOXAPARIN SODIUM 40 MG/0.4ML ~~LOC~~ SOLN
40.0000 mg | SUBCUTANEOUS | Status: DC
  Administered 2018-02-02: 40 mg via SUBCUTANEOUS
  Filled 2018-02-02: qty 0.4

## 2018-02-02 MED ORDER — MAGNESIUM HYDROXIDE 400 MG/5ML PO SUSP: 30.0000 mL | Freq: Every evening | ORAL | Status: DC | PRN

## 2018-02-02 MED ORDER — LORAZEPAM 1 MG PO TABS: 1.0000 mg | ORAL_TABLET | ORAL | Status: DC | PRN

## 2018-02-02 MED ORDER — SODIUM CHLORIDE 0.9 % IV SOLN
INTRAVENOUS | Status: DC
  Administered 2018-02-02: 07:00:00 via INTRAVENOUS

## 2018-02-02 MED ORDER — SODIUM CHLORIDE 0.9 % IV BOLUS
1000.0000 mL | Freq: Once | INTRAVENOUS | Status: AC
  Administered 2018-02-02: 1000 mL via INTRAVENOUS

## 2018-02-02 MED ORDER — GLUCERNA SHAKE PO LIQD
Freq: Three times a day (TID) | ORAL | Status: DC
  Administered 2018-02-02: 10:00:00 via ORAL
  Administered 2018-02-02: 237 mL via ORAL
  Filled 2018-02-02 (×6): qty 237

## 2018-02-02 MED ORDER — TRIAMCINOLONE ACETONIDE 0.5 % EX CREA
1.0000 "application " | TOPICAL_CREAM | Freq: Two times a day (BID) | CUTANEOUS | Status: DC
  Administered 2018-02-02 – 2018-02-03 (×2): 1 via TOPICAL
  Filled 2018-02-02 (×2): qty 15

## 2018-02-02 MED ORDER — MEMANTINE HCL ER 28 MG PO CP24
28.0000 mg | ORAL_CAPSULE | Freq: Every day | ORAL | Status: DC
  Administered 2018-02-02 – 2018-02-03 (×2): 28 mg via ORAL
  Filled 2018-02-02 (×2): qty 1

## 2018-02-02 MED ORDER — QUETIAPINE FUMARATE 50 MG PO TABS
100.0000 mg | ORAL_TABLET | Freq: Every day | ORAL | Status: DC
  Administered 2018-02-02: 100 mg via ORAL
  Filled 2018-02-02: qty 2

## 2018-02-02 MED ORDER — QUETIAPINE FUMARATE 25 MG PO TABS
25.0000 mg | ORAL_TABLET | Freq: Every day | ORAL | Status: DC
  Administered 2018-02-02: 25 mg via ORAL
  Filled 2018-02-02: qty 1

## 2018-02-02 MED ORDER — PAROXETINE HCL 20 MG PO TABS
20.0000 mg | ORAL_TABLET | Freq: Every day | ORAL | Status: DC
  Administered 2018-02-02: 20 mg via ORAL
  Filled 2018-02-02: qty 1

## 2018-02-02 MED ORDER — MELATONIN 3 MG PO TABS
6.0000 mg | ORAL_TABLET | Freq: Every day | ORAL | Status: DC
  Administered 2018-02-02: 6 mg via ORAL
  Filled 2018-02-02 (×2): qty 2

## 2018-02-02 MED ORDER — OLANZAPINE 5 MG PO TABS
5.0000 mg | ORAL_TABLET | Freq: Three times a day (TID) | ORAL | Status: DC | PRN
  Filled 2018-02-02: qty 1

## 2018-02-02 MED ORDER — POTASSIUM CHLORIDE CRYS ER 20 MEQ PO TBCR
40.0000 meq | EXTENDED_RELEASE_TABLET | Freq: Once | ORAL | Status: AC
  Administered 2018-02-02: 40 meq via ORAL
  Filled 2018-02-02: qty 2

## 2018-02-02 MED ORDER — LORAZEPAM 0.5 MG PO TABS
0.5000 mg | ORAL_TABLET | Freq: Four times a day (QID) | ORAL | Status: DC | PRN
  Administered 2018-02-02: 0.5 mg via ORAL
  Filled 2018-02-02 (×2): qty 1

## 2018-02-02 MED ORDER — ONDANSETRON HCL 4 MG/2ML IJ SOLN: 4.0000 mg | Freq: Four times a day (QID) | INTRAMUSCULAR | Status: DC | PRN

## 2018-02-02 MED ORDER — BISACODYL 10 MG RE SUPP: 10.0000 mg | Freq: Every day | RECTAL | Status: DC | PRN

## 2018-02-02 MED ORDER — LOPERAMIDE HCL 2 MG PO CAPS: 2.0000 mg | ORAL_CAPSULE | ORAL | Status: DC | PRN

## 2018-02-02 MED ORDER — ACETAMINOPHEN 500 MG PO TABS: 500.0000 mg | ORAL_TABLET | ORAL | Status: DC | PRN

## 2018-02-02 NOTE — ED Provider Notes (Signed)
[facility] EMERGENCY DEPARTMENT Provider Note   CSN: [id number] Arrival date & time: 02/01/18  1809     History   Chief Complaint Chief Complaint  Patient presents with  . Fall    HPI Antonio Duran is a 77 y.o. male.  HPI  77 year old male with a history of Alzheimer's dementia residing at [facility] house memory care presents with concern for 3 falls today.  Staff reported normal mental status with history of dementia.  He was noted to be orthostatic with EMS. On arrival, patient was agitated, and received 0.5 mg of Ativan, and further history from patient is limited both by his mental status following medication, as well as his history of dementia.  Facility reports he was having difficulty walking.  No focal weakness identified. Report he has acted this way with UTIs in the past. Report he can be sleepy after receiving medications for agitation but did not receive any today. They report he is normally able to walk without any problems and his multiple falls today in a row were concerning. It seemed as if he "couldn't get his balance."  Patient denies any pain or concerns.    Past Medical History:  Diagnosis Date  . Alzheimer's dementia   . Enterococcus UTI     Patient Active Problem List   Diagnosis Date Noted  . Fall 02/02/2018  . Depression 02/02/2018  . UTI (urinary tract infection) 08/10/2017  . HCAP (healthcare-associated pneumonia) 08/10/2017  . Sepsis, unspecified organism (HCC) 08/09/2017  . Acute renal failure superimposed on stage 3 chronic kidney disease (HCC) 08/09/2017  . Sepsis (HCC) 08/09/2017  . Normocytic anemia   . Lung nodule, solitary   . Mixed dementia   . Palliative care by specialist   . Agitation   . Insomnia   . Enterococcus UTI 01/18/2016  . Confusion 01/18/2016  . Dementia with behavioral disturbance 01/18/2016    History reviewed. No pertinent surgical history.      Home Medications    Prior to Admission  medications   Medication Sig Start Date End Date Taking? Authorizing Provider  acetaminophen (TYLENOL) 500 MG tablet Take 500 mg by mouth every 4 (four) hours as needed for mild pain, moderate pain, fever or headache.    Yes [provider]  albuterol (PROVENTIL) (2.5 MG/3ML) 0.083% nebulizer solution Take 2.5 mg by nebulization every 6 (six) hours as needed for wheezing or shortness of breath.   Yes [provider]  alum & mag hydroxide-simeth (MINTOX) 200-200-20 MG/5ML suspension Take 30 mLs by mouth every 6 (six) hours as needed for indigestion or heartburn.   Yes [provider]  bisacodyl (DULCOLAX) 10 MG suppository Place 10 mg rectally daily as needed for moderate constipation.   Yes [provider]  divalproex (DEPAKOTE SPRINKLE) 125 MG capsule Take 250 mg by mouth 3 (three) times daily.    Yes [provider]  estradiol (ESTRACE) 0.5 MG tablet Take 0.5 mg by mouth daily.   Yes [provider]  guaifenesin (ROBITUSSIN) 100 MG/5ML syrup Take 200 mg by mouth every 6 (six) hours as needed for cough.   Yes [provider]  loperamide (IMODIUM) 2 MG capsule Take 2 mg by mouth as needed for diarrhea or loose stools.   Yes [provider]  LORazepam (ATIVAN) 1 MG tablet Take 1 tablet (1 mg total) by mouth every 4 (four) hours as needed for anxiety or sedation. 07/30/17  Yes Charm Rings, NP  magnesium hydroxide (MILK  OF MAGNESIA) 400 MG/5ML suspension Take 30 mLs by mouth at bedtime as needed for mild constipation.   Yes [provider]  Melatonin 5 MG TABS Take 1 tablet by mouth every evening.   Yes [provider]  memantine (NAMENDA XR) 28 MG CP24 24 hr capsule Take 28 mg by mouth daily.    Yes [provider]  neomycin-bacitracin-polymyxin (NEOSPORIN) [phone] ointment Apply 1 application topically 4 (four) times daily as needed (skin tears).    Yes [provider]  Nutritional  Supplements (NUTRITIONAL SHAKE PO) Take 237 mLs by mouth 3 (three) times daily. Mighty Shakes   Yes [provider]  OLANZapine (ZYPREXA) 5 MG tablet Take 1 tablet (5 mg total) by mouth every 8 (eight) hours as needed (for aggitation). 07/30/17  Yes Plunkett, Alphonzo Lemmings, MD  PARoxetine (PAXIL) 20 MG tablet Take 1 tablet (20 mg total) by mouth at bedtime. 07/30/17  Yes Charm Rings, NP  QUEtiapine (SEROQUEL) 100 MG tablet Take 1 tablet (100 mg total) by mouth at bedtime. 07/30/17  Yes Lord, Herminio Heads, NP  QUEtiapine (SEROQUEL) 25 MG tablet Take 25 mg by mouth daily at 2 PM.    Yes [provider]  sennosides-docusate sodium (SENOKOT-S) 8.6-50 MG tablet Take 2 tablets by mouth daily.   Yes [provider]  triamcinolone cream (KENALOG) 0.5 % Apply 1 application topically 2 (two) times daily.   Yes [provider]    Family History Family History  Problem Relation Age of Onset  . Dementia Sister     Social History Social History   Tobacco Use  . Smoking status: Former Games developer  . Smokeless tobacco: Never Used  Substance Use Topics  . Alcohol use: No  . Drug use: No     Allergies   Zolpidem   Review of Systems Review of Systems  Unable to perform ROS: Dementia     Physical Exam Updated Vital Signs BP 117/87   Pulse (!) 37   Temp 98.1 F (36.7 C) (Axillary)   Resp 13   SpO2 93%   Physical Exam  Constitutional: He appears well-developed and well-nourished. No distress.  HENT:  Head: Normocephalic and atraumatic.  Eyes: Conjunctivae and EOM are normal.  Cardiovascular: Normal rate, regular rhythm, normal heart sounds and intact distal pulses. Exam reveals no gallop and no friction rub.  No murmur heard. Pulmonary/Chest: Effort normal and breath sounds normal. No respiratory distress. He has no wheezes. He has no rales.  Abdominal: Soft. He exhibits no distension. There is no tenderness. There is no guarding.  Musculoskeletal: He exhibits  no edema.  Skin tear elbow  Neurological:  Symmetric smile, 5/5 strength upper and lower extremities, EOM intact, unable to understand directions for finger to nose, able to pull blankets over self.  Initially sleepy, however after woken following commands as above  Skin: Skin is warm and dry. He is not diaphoretic.  Nursing note and vitals reviewed.    ED Treatments / Results  Labs (all labs ordered are listed, but only abnormal results are displayed) Labs Reviewed  COMPREHENSIVE METABOLIC PANEL - Abnormal; Notable for the following components:      Result Value   Chloride 115 (*)    CO2 20 (*)    Glucose, Bld 115 (*)    BUN 24 (*)    Creatinine, Ser 1.36 (*)    Calcium 8.0 (*)    Total Protein 5.4 (*)    Albumin 2.6 (*)    AST  14 (*)    GFR calc non Af Amer 49 (*)    GFR calc Af Amer 57 (*)    All other components within normal limits  CBC WITH DIFFERENTIAL/PLATELET - Abnormal; Notable for the following components:   RBC 3.05 (*)    Hemoglobin 8.9 (*)    HCT 29.0 (*)    All other components within normal limits  URINALYSIS, ROUTINE W REFLEX MICROSCOPIC - Abnormal; Notable for the following components:   Hgb urine dipstick MODERATE (*)    RBC / HPF >50 (*)    All other components within normal limits  URINE CULTURE  I-STAT CG4 LACTIC ACID, ED  I-STAT CG4 LACTIC ACID, ED    EKG None  Radiology Ct Head Wo Contrast  Result Date: 02/01/2018 CLINICAL DATA:  Alzheimer's.  Falls EXAM: CT HEAD WITHOUT CONTRAST CT CERVICAL SPINE WITHOUT CONTRAST TECHNIQUE: Multidetector CT imaging of the head and cervical spine was performed following the standard protocol without intravenous contrast. Multiplanar CT image reconstructions of the cervical spine were also generated. COMPARISON:  01/01/2018 FINDINGS: CT HEAD FINDINGS Brain: There is atrophy and chronic small vessel disease changes. Old right frontal infarct. No acute intracranial abnormality. Specifically, no hemorrhage,  hydrocephalus, mass lesion, acute infarction, or significant intracranial injury. Vascular: No hyperdense vessel or unexpected calcification. Skull: No acute calvarial abnormality. Sinuses/Orbits: Visualized paranasal sinuses and mastoids clear. Orbital soft tissues unremarkable. Other: None CT CERVICAL SPINE FINDINGS Alignment: No subluxation. Skull base and vertebrae: No acute fracture. No primary bone lesion or focal pathologic process. Soft tissues and spinal canal: No prevertebral fluid or swelling. No visible canal hematoma. Disc levels:  Diffuse degenerative disc and facet disease. Upper chest: No acute findings Other: No acute findings IMPRESSION: Atrophy, chronic small vessel disease.  Old right frontal infarct. No acute intracranial abnormality. Diffuse cervical spondylosis.  No acute bony abnormality. Electronically Signed   By: Charlett NoseKevin  Dover M.D.   On: 02/01/2018 22:56   Ct Cervical Spine Wo Contrast  Result Date: 02/01/2018 CLINICAL DATA:  Alzheimer's.  Falls EXAM: CT HEAD WITHOUT CONTRAST CT CERVICAL SPINE WITHOUT CONTRAST TECHNIQUE: Multidetector CT imaging of the head and cervical spine was performed following the standard protocol without intravenous contrast. Multiplanar CT image reconstructions of the cervical spine were also generated. COMPARISON:  01/01/2018 FINDINGS: CT HEAD FINDINGS Brain: There is atrophy and chronic small vessel disease changes. Old right frontal infarct. No acute intracranial abnormality. Specifically, no hemorrhage, hydrocephalus, mass lesion, acute infarction, or significant intracranial injury. Vascular: No hyperdense vessel or unexpected calcification. Skull: No acute calvarial abnormality. Sinuses/Orbits: Visualized paranasal sinuses and mastoids clear. Orbital soft tissues unremarkable. Other: None CT CERVICAL SPINE FINDINGS Alignment: No subluxation. Skull base and vertebrae: No acute fracture. No primary bone lesion or focal pathologic process. Soft tissues and  spinal canal: No prevertebral fluid or swelling. No visible canal hematoma. Disc levels:  Diffuse degenerative disc and facet disease. Upper chest: No acute findings Other: No acute findings IMPRESSION: Atrophy, chronic small vessel disease.  Old right frontal infarct. No acute intracranial abnormality. Diffuse cervical spondylosis.  No acute bony abnormality. Electronically Signed   By: Charlett NoseKevin  Dover M.D.   On: 02/01/2018 22:56    Procedures Procedures (including critical care time)  Medications Ordered in ED Medications  LORazepam (ATIVAN) injection 0.5 mg (0.5 mg Intravenous Given 02/01/18 1850)     Initial Impression / Assessment and Plan / ED Course  I have reviewed the triage vital signs and the nursing notes.  Pertinent labs &  imaging results that were available during my care of the patient were reviewed by me and considered in my medical decision making (see chart for details).     77 year old male with a history of Alzheimer's dementia residing at [facility] house memory care presents with concern for 3 falls today.    Labs show anemia however not significantly different from prior, no hx to suggest acute GI bleed.  No other significant lab abnormalities. Pulse ox reading low heart rate at times however ECG/telemetry rate higher and no signs of significant bradycardia, normal blood pressures.  He has normal neurologic exam, normal strength bilaterally, normal CN nerves, doubt acute CVA, although we are unable to ambulate him and he does not understand coordination exam and posterior circulation CVA is on differential.  UA shows blood but no UTI. No other signs of infection. Will check CXR but doubt pneumonia by hx and exam.   CT shows no sign of injury, prior CVA.  Given patient is normally ambulatory at baseline and acutely is having difficulty with ambulation with multiple falls, will admit for continued evaluation, including MRI to evaluate for ?posterior CVA, allow time to metabolize  medications as possible contributor, and work with PT. Possible that difficulty walking is due to dementia however the facility reports a very acute change from baseline. Will admit for continued care.  Final Clinical Impressions(s) / ED Diagnoses   Final diagnoses:  Fall, initial encounter  Multiple falls  Difficulty balancing  Encephalopathy    ED Discharge Orders    None       Alvira Monday, MD 02/02/18 0200

## 2018-02-02 NOTE — Progress Notes (Signed)
[facility]. No family with patient.

## 2018-02-02 NOTE — Discharge Summary (Signed)
Physician Discharge Summary  Antonio Duran ZOX:[id number] DOB: 04/17/1941 DOA: 02/01/2018  PCP: Uvaldo Bristle, PA-C  Admit date: 02/01/2018 Discharge date: 02/02/2018  Time spent: 65 minutes  Recommendations for Outpatient Follow-up:  1. Monitor BP daily for 14 days to evaluate for HTN 2. If patient continues to fall and is not orthostatic consider changing Ativan dose 3. Discharge to [facility]. Recommend bmet 2 days evaluate potassium level. Monitor BP as noted above. Monitor for hydration/thirst. Recommend bmet to follow potassium level.  Follow up with PCP 1 week evaluation of orthostatic hypotension   Discharge Diagnoses:  Principal Problem:   Fall Active Problems:   Acute renal failure superimposed on stage 3 chronic kidney disease (HCC)   Dementia with behavioral disturbance   Normocytic anemia   Hypokalemia   Depression with anxiety   Discharge Condition: stable at baseline  Diet recommendation: heart healthy  There were no vitals filed for this visit.  History of present illness:  Patient with hx alzheimer's, CKD 3, UTI, anxiety and dementia presented to ED on 9/23 cc falls. Per report, patient's mental status  at his normal baseline. Pt was reportedly to have 3 falls in 30 min . He was noted to be orthostatic with EMS. Per EDP, on arrival, patient was agitated, and received 0.5 mg of Ativan. In ED, he was sleepy, but arousable. He knew his own name, but not oriented to place and time.  He denied any pain anywhere.  He moved all extremities.  No facial droop or slurred speech.  No active respiratory distress, cough, nausea, vomiting noted.  Hospital Course:  Fall:   Likely  to orthostatic hypotension. Per EMS patient orthostatic in field. Chart review indicates BP 124/62 upon arrival and dropped to 98/58 when standing.  No seizure activity.    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  Patient had bradycardia  with heart rate 49-87.  EKG showed normal sinus rhythm, no ischemic change.  Pt is on high dose of Ativan 1 mg prn q4h at home, which may have contributed partially. No events on tele. He received IV fluids and at discharge no longer orthostatic. No focal neuro defecits. No metabolic derangement. If continues to fall without being orthostatic consider adjusting Ativan.  AoCKD-III: Baseline Cre is 1.0, pt's Cre is 1.36 and BUN 24 on admission. Likely due to dehydration. Resolved at discharge  Normocytic anemia: Hemoglobin 10.4 on 08/13/2017--> 8.9 today.  No active bleeding noted. -Check anemia panel  Depression and anxiety -patient agitated initially. Resume ativan home dose  -Depakote, Paxil, Seroquel  Dementia with behavior change -Namenda and olanzapine  Procedures:    Consultations:  none  Discharge Exam: Vitals:   02/02/18 0417 02/02/18 1011  BP: (!) 150/103 (!) 115/104  Pulse: (!) 49 89  Resp: 18   Temp:    SpO2: 100%     General: sitting up in bed being fed breakfast and eating. Smiling. Mitts on.  Cardiovascular: rrr no mgr no LE edema Respiratory: normal effort BS clear bilaterally no wheeze Neuro: alert oriented to self only. Attempts to follow commands. Quite verbal but unable to make wants and needs known. Moving all extremities pleasant but uncooperative  Discharge Instructions   Discharge Instructions    Call MD for:  persistant dizziness or light-headedness   Complete by:  As directed    Diet - low sodium heart healthy   Complete by:  As directed    Discharge instructions  Complete by:  As directed    Monitor and document BP daily for 2 weeks. Monitor for hydration/thirs If falls persist without patient being orthostatic, consider modifying Ativan dose   Increase activity slowly   Complete by:  As directed      Allergies as of 02/02/2018      Reactions   Zolpidem Other (See Comments)   Reaction:  Causes pt to sleep walk  Sleep walking       Medication List    TAKE these medications   acetaminophen 500 MG tablet Commonly known as:  TYLENOL Take 500 mg by mouth every 4 (four) hours as needed for mild pain, moderate pain, fever or headache.   albuterol (2.5 MG/3ML) 0.083% nebulizer solution Commonly known as:  PROVENTIL Take 2.5 mg by nebulization every 6 (six) hours as needed for wheezing or shortness of breath.   bisacodyl 10 MG suppository Commonly known as:  DULCOLAX Place 10 mg rectally daily as needed for moderate constipation.   divalproex 125 MG capsule Commonly known as:  DEPAKOTE SPRINKLE Take 250 mg by mouth 3 (three) times daily.   estradiol 0.5 MG tablet Commonly known as:  ESTRACE Take 0.5 mg by mouth daily.   guaifenesin 100 MG/5ML syrup Commonly known as:  ROBITUSSIN Take 200 mg by mouth every 6 (six) hours as needed for cough.   loperamide 2 MG capsule Commonly known as:  IMODIUM Take 2 mg by mouth as needed for diarrhea or loose stools.   LORazepam 1 MG tablet Commonly known as:  ATIVAN Take 1 tablet (1 mg total) by mouth every 4 (four) hours as needed for anxiety or sedation.   magnesium hydroxide 400 MG/5ML suspension Commonly known as:  MILK OF MAGNESIA Take 30 mLs by mouth at bedtime as needed for mild constipation.   Melatonin 5 MG Tabs Take 1 tablet by mouth every evening.   MINTOX 200-200-20 MG/5ML suspension Generic drug:  alum & mag hydroxide-simeth Take 30 mLs by mouth every 6 (six) hours as needed for indigestion or heartburn.   NAMENDA XR 28 MG Cp24 24 hr capsule Generic drug:  memantine Take 28 mg by mouth daily.   neomycin-bacitracin-polymyxin [phone] ointment Apply 1 application topically 4 (four) times daily as needed (skin tears).   NUTRITIONAL SHAKE PO Take 237 mLs by mouth 3 (three) times daily. Mighty Shakes   OLANZapine 5 MG tablet Commonly known as:  ZYPREXA Take 1 tablet (5 mg total) by mouth every 8 (eight) hours as needed (for aggitation).    PARoxetine 20 MG tablet Commonly known as:  PAXIL Take 1 tablet (20 mg total) by mouth at bedtime.   QUEtiapine 25 MG tablet Commonly known as:  SEROQUEL Take 25 mg by mouth daily at 2 PM.   QUEtiapine 100 MG tablet Commonly known as:  SEROQUEL Take 1 tablet (100 mg total) by mouth at bedtime.   sennosides-docusate sodium 8.6-50 MG tablet Commonly known as:  SENOKOT-S Take 2 tablets by mouth daily.   triamcinolone cream 0.5 % Commonly known as:  KENALOG Apply 1 application topically 2 (two) times daily.      Allergies  Allergen Reactions  . Zolpidem Other (See Comments)    Reaction:  Causes pt to sleep walk  Sleep walking      The results of significant diagnostics from this hospitalization (including imaging, microbiology, ancillary and laboratory) are listed below for reference.    Significant Diagnostic Studies: Dg Chest 2 View  Result Date: 02/02/2018 CLINICAL DATA:  Fall, altered mental status EXAM: CHEST - 2 VIEW COMPARISON:  08/09/2017 chest radiograph. FINDINGS: Stable cardiomediastinal silhouette with normal heart size. No pneumothorax. No pleural effusion. Lungs appear clear, with no acute consolidative airspace disease and no pulmonary edema. No displaced fractures. Scattered healed right rib deformities. IMPRESSION: No active cardiopulmonary disease. Electronically Signed   By: Delbert Phenix M.D.   On: 02/02/2018 02:34   Ct Head Wo Contrast  Result Date: 02/01/2018 CLINICAL DATA:  Alzheimer's.  Falls EXAM: CT HEAD WITHOUT CONTRAST CT CERVICAL SPINE WITHOUT CONTRAST TECHNIQUE: Multidetector CT imaging of the head and cervical spine was performed following the standard protocol without intravenous contrast. Multiplanar CT image reconstructions of the cervical spine were also generated. COMPARISON:  01/01/2018 FINDINGS: CT HEAD FINDINGS Brain: There is atrophy and chronic small vessel disease changes. Old right frontal infarct. No acute intracranial abnormality.  Specifically, no hemorrhage, hydrocephalus, mass lesion, acute infarction, or significant intracranial injury. Vascular: No hyperdense vessel or unexpected calcification. Skull: No acute calvarial abnormality. Sinuses/Orbits: Visualized paranasal sinuses and mastoids clear. Orbital soft tissues unremarkable. Other: None CT CERVICAL SPINE FINDINGS Alignment: No subluxation. Skull base and vertebrae: No acute fracture. No primary bone lesion or focal pathologic process. Soft tissues and spinal canal: No prevertebral fluid or swelling. No visible canal hematoma. Disc levels:  Diffuse degenerative disc and facet disease. Upper chest: No acute findings Other: No acute findings IMPRESSION: Atrophy, chronic small vessel disease.  Old right frontal infarct. No acute intracranial abnormality. Diffuse cervical spondylosis.  No acute bony abnormality. Electronically Signed   By: Charlett Nose M.D.   On: 02/01/2018 22:56   Ct Cervical Spine Wo Contrast  Result Date: 02/01/2018 CLINICAL DATA:  Alzheimer's.  Falls EXAM: CT HEAD WITHOUT CONTRAST CT CERVICAL SPINE WITHOUT CONTRAST TECHNIQUE: Multidetector CT imaging of the head and cervical spine was performed following the standard protocol without intravenous contrast. Multiplanar CT image reconstructions of the cervical spine were also generated. COMPARISON:  01/01/2018 FINDINGS: CT HEAD FINDINGS Brain: There is atrophy and chronic small vessel disease changes. Old right frontal infarct. No acute intracranial abnormality. Specifically, no hemorrhage, hydrocephalus, mass lesion, acute infarction, or significant intracranial injury. Vascular: No hyperdense vessel or unexpected calcification. Skull: No acute calvarial abnormality. Sinuses/Orbits: Visualized paranasal sinuses and mastoids clear. Orbital soft tissues unremarkable. Other: None CT CERVICAL SPINE FINDINGS Alignment: No subluxation. Skull base and vertebrae: No acute fracture. No primary bone lesion or focal  pathologic process. Soft tissues and spinal canal: No prevertebral fluid or swelling. No visible canal hematoma. Disc levels:  Diffuse degenerative disc and facet disease. Upper chest: No acute findings Other: No acute findings IMPRESSION: Atrophy, chronic small vessel disease.  Old right frontal infarct. No acute intracranial abnormality. Diffuse cervical spondylosis.  No acute bony abnormality. Electronically Signed   By: Charlett Nose M.D.   On: 02/01/2018 22:56    Microbiology: Recent Results (from the past 240 hour(s))  MRSA PCR Screening     Status: None   Collection Time: 02/02/18  8:10 AM  Result Value Ref Range Status   MRSA by PCR NEGATIVE NEGATIVE Final    Comment:        The GeneXpert MRSA Assay (FDA approved for NASAL specimens only), is one component of a comprehensive MRSA colonization surveillance program. It is not intended to diagnose MRSA infection nor to guide or monitor treatment for MRSA infections. Performed at [facility], [address], [place], [place] [address]      Labs: Basic Metabolic  Panel: Recent Labs  Lab 02/01/18 1841 02/02/18 0804  NA 144 145  K 3.7 3.1*  CL 115* 122*  CO2 20* 20*  GLUCOSE 115* 73  BUN 24* 12  CREATININE 1.36* 0.80  CALCIUM 8.0* 6.6*   Liver Function Tests: Recent Labs  Lab 02/01/18 1841  AST 14*  ALT 12  ALKPHOS 50  BILITOT 0.3  PROT 5.4*  ALBUMIN 2.6*   No results for input(s): LIPASE, AMYLASE in the last 168 hours. No results for input(s): AMMONIA in the last 168 hours. CBC: Recent Labs  Lab 02/01/18 1841 02/02/18 0804  WBC 4.6 4.2  NEUTROABS 2.7  --   HGB 8.9* 9.0*  HCT 29.0* 29.1*  MCV 95.1 95.1  PLT 184 181   Cardiac Enzymes: No results for input(s): CKTOTAL, CKMB, CKMBINDEX, TROPONINI in the last 168 hours. BNP: BNP (last 3 results) No results for input(s): BNP in the last 8760 hours.  ProBNP (last 3 results) No results for input(s): PROBNP in the last 8760 hours.  CBG: No  results for input(s): GLUCAP in the last 168 hours.     Signed:  Gwenyth BenderBLACK,Zarea Diesing M MD.  [facility] 02/02/2018, 1:04 PM

## 2018-02-02 NOTE — Evaluation (Signed)
Occupational Therapy Evaluation Patient Details Name: Antonio Duran MRN: [id number] DOB: 1941/03/26 Today's Date: 02/02/2018    History of Present Illness Antonio Duran is a 77 y.o. male with medical history significant of Alzheimer's disease, CKD-3, enterococcus UTI, depression, anxiety, who presents with fall, etiology unclear, but EMS reports orthostatic in the field; Comes from SNF, per H&P at baseline cognition in ED.   Clinical Impression   Patient was very agitated and confused today. Patient was not able to consistently follow directions. Patient was pulling on lines and tubes and was wearing mitts they were kept on during session. Patient was able to amb with 2 person assist with patient attempting to sit down without warning. Attempted to have patient sit in chair but patient climbed into chair on knees and was not able to sit down on bottom. Patient was assisted off the chair and was returned to bed. Patient is not able to follow directions currently and is not appropriate for OT. Patient should be seen by OT at SNF to assess needs.    Follow Up Recommendations  SNF    Equipment Recommendations  None recommended by OT    Recommendations for Other Services       Precautions / Restrictions Precautions Precautions: Fall Restrictions Weight Bearing Restrictions: No      Mobility Bed Mobility                  Transfers Overall transfer level: Needs assistance   Transfers: Sit to/from Stand Sit to Stand: Min assist;+2 safety/equipment         General transfer comment: Patient requires 2 person assist for safety. Patient is pulling at lines and catheter and had to have mitts on. Patient is impulsive and will sit down without notice.     Balance                                           ADL either performed or assessed with clinical judgement   ADL Overall ADL's : At baseline                                        General ADL Comments: Assume patient required extensive assist secondary to baseline performance.     Vision         Perception     Praxis      Pertinent Vitals/Pain Pain Assessment: Faces Pain Score: 0-No pain     Hand Dominance     Extremity/Trunk Assessment Upper Extremity Assessment Upper Extremity Assessment: Difficult to assess due to impaired cognition           Communication Communication Communication: No difficulties   Cognition Arousal/Alertness: Awake/alert Behavior During Therapy: Agitated Overall Cognitive Status: No family/caregiver present to determine baseline cognitive functioning                                 General Comments: Patient was confused and agitiated  during the session. Unsure of patient cognive level at [facility]   General Comments       Exercises     Shoulder Instructions      Home Living Family/patient expects to be discharged to:: Skilled nursing facility  Additional Comments: Patient was at SNF when he was admitted to hospital.      Prior Functioning/Environment          Comments: unknown, assume Pt. required extensive assist with ADLs secondary to patient cognive level.         OT Problem List:        OT Treatment/Interventions:      OT Goals(Current goals can be found in the care plan section) Acute Rehab OT Goals Patient Stated Goal: non stated  OT Frequency:     Barriers to D/C:            Co-evaluation PT/OT/SLP Co-Evaluation/Treatment: Yes Reason for Co-Treatment: Necessary to address cognition/behavior during functional activity;For patient/therapist safety   OT goals addressed during session: ADL's and self-care      AM-PAC PT "6 Clicks" Daily Activity     Outcome Measure Help from another person eating meals?: Total Help from another person taking care of personal grooming?: Total Help from another person toileting, which includes  using toliet, bedpan, or urinal?: Total Help from another person bathing (including washing, rinsing, drying)?: Total Help from another person to put on and taking off regular upper body clothing?: Total Help from another person to put on and taking off regular lower body clothing?: Total 6 Click Score: 6   End of Session Equipment Utilized During Treatment: Gait belt Nurse Communication: (ok therapy)  Activity Tolerance: Patient tolerated treatment well Patient left: in bed;with call bell/phone within reach;with nursing/sitter in room                   Time: 1120-1135 OT Time Calculation (min): 15 min Charges:  OT General Charges $OT Visit: 1 Visit OT Evaluation $OT Eval Low Complexity: 1 Low  6 clicks  Antonio Duran 02/02/2018, 12:26 PM

## 2018-02-02 NOTE — H&P (Signed)
History and Physical    Antonio Duran ZOX:[id number] DOB: 11/02/1940 DOA: 02/01/2018  Referring MD/NP/PA:   PCP: Uvaldo BristleKurth-Bowen, [name], PA-C   Patient coming from:  The patient is coming from SNF.  At baseline, pt is dependent for most of ADL.   Chief Complaint: fall  HPI: Antonio Duran is a 77 y.o. male with medical history significant of Alzheimer's disease, CKD-3, enterococcus UTI, depression, anxiety, who presents with fall.  Patient has dementia, and is unable to provide accurate medical history, therefore, most of the history is obtained by discussing the case with ED physician, per EMS report, and with the nursing staff. Per report, patient's mental status is at his normal baseline. Pt was reportedly to have 3 falls in 30 min today. He was noted to be orthostatic with EMS. Per EDP, on arrival, patient was agitated, and received 0.5 mg of Ativan. When I saw pt in ED, he is sleepy, but arousable. He knows his own name, but not oriented to place and time.  He denies any pain anywhere.  He moves all extremities.  No facial droop or slurred speech.  No active respiratory distress, cough, nausea, vomiting noted.  ED Course: pt was found to have WBC 4.6, lactic acid of 1.67, worsening renal function, temperature normal, bradycardia, no tachypnea, oxygen saturation 93% on room air.  Chest x-ray is negative.  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  C-spine has no vertebral fracture, showed spondylosis.  Patient is placed on telemetry bed for observation.  Review of Systems: Could not reviewed accurately due to dementia.   Allergy:  Allergies  Allergen Reactions  . Zolpidem Other (See Comments)    Reaction:  Causes pt to sleep walk  Sleep walking    Past Medical History:  Diagnosis Date  . Alzheimer's dementia   . Enterococcus UTI     History reviewed. No pertinent surgical history.  Social History:  reports that he has  quit smoking. He has never used smokeless tobacco. He reports that he does not drink alcohol or use drugs.  Family History:  Family History  Problem Relation Age of Onset  . Dementia Sister      Prior to Admission medications   Medication Sig Start Date End Date Taking? Authorizing Provider  acetaminophen (TYLENOL) 500 MG tablet Take 500 mg by mouth every 4 (four) hours as needed for mild pain, moderate pain, fever or headache.    Yes [provider]  albuterol (PROVENTIL) (2.5 MG/3ML) 0.083% nebulizer solution Take 2.5 mg by nebulization every 6 (six) hours as needed for wheezing or shortness of breath.   Yes [provider]  alum & mag hydroxide-simeth (MINTOX) 200-200-20 MG/5ML suspension Take 30 mLs by mouth every 6 (six) hours as needed for indigestion or heartburn.   Yes [provider]  bisacodyl (DULCOLAX) 10 MG suppository Place 10 mg rectally daily as needed for moderate constipation.   Yes [provider]  divalproex (DEPAKOTE SPRINKLE) 125 MG capsule Take 250 mg by mouth 3 (three) times daily.    Yes [provider]  estradiol (ESTRACE) 0.5 MG tablet Take 0.5 mg by mouth daily.   Yes [provider]  guaifenesin (ROBITUSSIN) 100 MG/5ML syrup Take 200 mg by mouth every 6 (six) hours as needed for cough.   Yes [provider]  loperamide (IMODIUM) 2 MG capsule Take 2 mg by mouth as needed for diarrhea or loose stools.   Yes [provider]  LORazepam (ATIVAN) 1 MG tablet Take 1 tablet (1 mg total) by mouth every 4 (four) hours as needed for anxiety or sedation. 07/30/17  Yes Charm Rings, NP  magnesium hydroxide (MILK OF MAGNESIA) 400 MG/5ML suspension Take 30 mLs by mouth at bedtime as needed for mild constipation.   Yes [provider]  Melatonin 5 MG TABS Take 1 tablet by mouth every evening.   Yes [provider]  memantine (NAMENDA XR) 28 MG CP24 24 hr capsule Take 28 mg by mouth daily.     Yes [provider]  neomycin-bacitracin-polymyxin (NEOSPORIN) [phone] ointment Apply 1 application topically 4 (four) times daily as needed (skin tears).    Yes [provider]  Nutritional Supplements (NUTRITIONAL SHAKE PO) Take 237 mLs by mouth 3 (three) times daily. Mighty Shakes   Yes [provider]  OLANZapine (ZYPREXA) 5 MG tablet Take 1 tablet (5 mg total) by mouth every 8 (eight) hours as needed (for aggitation). 07/30/17  Yes Plunkett, Alphonzo Lemmings, MD  PARoxetine (PAXIL) 20 MG tablet Take 1 tablet (20 mg total) by mouth at bedtime. 07/30/17  Yes Charm Rings, NP  QUEtiapine (SEROQUEL) 100 MG tablet Take 1 tablet (100 mg total) by mouth at bedtime. 07/30/17  Yes Lord, Herminio Heads, NP  QUEtiapine (SEROQUEL) 25 MG tablet Take 25 mg by mouth daily at 2 PM.    Yes [provider]  sennosides-docusate sodium (SENOKOT-S) 8.6-50 MG tablet Take 2 tablets by mouth daily.   Yes [provider]  triamcinolone cream (KENALOG) 0.5 % Apply 1 application topically 2 (two) times daily.   Yes [provider]    Physical Exam: Vitals:   02/02/18 0200 02/02/18 0257 02/02/18 0330 02/02/18 0417  BP: 109/74  116/85 (!) 150/103  Pulse: (!) 56  (!) 56 (!) 49  Resp: 13  12 18   Temp:      TempSrc:      SpO2: 100% 100% 97% 100%   General: Not in acute distress HEENT:       Eyes: PERRL, EOMI, no scleral icterus.       ENT: No discharge from the ears and nose, no pharynx injection, no tonsillar enlargement.        Neck: No JVD, no bruit, no mass felt. Heme: No neck lymph node enlargement. Cardiac: S1/S2, RRR, No murmurs, No gallops or rubs. Respiratory: No rales, wheezing, rhonchi or rubs. GI: Soft, nondistended, nontender, no organomegaly, BS present. GU: No hematuria Ext: No pitting leg edema bilaterally. 2+DP/PT pulse bilaterally. Musculoskeletal: No joint deformities, No joint redness or warmth, no limitation of ROM in spin. Skin: No rashes.    Neuro: sleepy, knows his own name, not oriented to place and time, cranial nerves II-XII grossly intact, moves all extremities. Psych: Patient is not psychotic, no suicidal or hemocidal ideation.  Labs on Admission: I have personally reviewed following labs and imaging studies  CBC: Recent Labs  Lab 02/01/18 1841  WBC 4.6  NEUTROABS 2.7  HGB 8.9*  HCT 29.0*  MCV 95.1  PLT 184   Basic Metabolic Panel: Recent Labs  Lab 02/01/18 1841  NA 144  K 3.7  CL 115*  CO2 20*  GLUCOSE 115*  BUN 24*  CREATININE 1.36*  CALCIUM 8.0*   GFR: CrCl cannot be calculated (Unknown ideal weight.). Liver Function Tests: Recent Labs  Lab 02/01/18 1841  AST 14*  ALT 12  ALKPHOS 50  BILITOT 0.3  PROT 5.4*  ALBUMIN 2.6*   No results  for input(s): LIPASE, AMYLASE in the last 168 hours. No results for input(s): AMMONIA in the last 168 hours. Coagulation Profile: No results for input(s): INR, PROTIME in the last 168 hours. Cardiac Enzymes: No results for input(s): CKTOTAL, CKMB, CKMBINDEX, TROPONINI in the last 168 hours. BNP (last 3 results) No results for input(s): PROBNP in the last 8760 hours. HbA1C: No results for input(s): HGBA1C in the last 72 hours. CBG: No results for input(s): GLUCAP in the last 168 hours. Lipid Profile: No results for input(s): CHOL, HDL, LDLCALC, TRIG, CHOLHDL, LDLDIRECT in the last 72 hours. Thyroid Function Tests: No results for input(s): TSH, T4TOTAL, FREET4, T3FREE, THYROIDAB in the last 72 hours. Anemia Panel: No results for input(s): VITAMINB12, FOLATE, FERRITIN, TIBC, IRON, RETICCTPCT in the last 72 hours. Urine analysis:    Component Value Date/Time   COLORURINE YELLOW 02/02/2018 0036   APPEARANCEUR CLEAR 02/02/2018 0036   LABSPEC 1.015 02/02/2018 0036   PHURINE 6.0 02/02/2018 0036   GLUCOSEU NEGATIVE 02/02/2018 0036   HGBUR MODERATE (A) 02/02/2018 0036   BILIRUBINUR NEGATIVE 02/02/2018 0036   KETONESUR NEGATIVE 02/02/2018 0036   PROTEINUR  NEGATIVE 02/02/2018 0036   NITRITE NEGATIVE 02/02/2018 0036   LEUKOCYTESUR NEGATIVE 02/02/2018 0036   Sepsis Labs: @LABRCNTIP (procalcitonin:4,lacticidven:4) )No results found for this or any previous visit (from the past 240 hour(s)).   Radiological Exams on Admission: Dg Chest 2 View  Result Date: 02/02/2018 CLINICAL DATA:  Fall, altered mental status EXAM: CHEST - 2 VIEW COMPARISON:  08/09/2017 chest radiograph. FINDINGS: Stable cardiomediastinal silhouette with normal heart size. No pneumothorax. No pleural effusion. Lungs appear clear, with no acute consolidative airspace disease and no pulmonary edema. No displaced fractures. Scattered healed right rib deformities. IMPRESSION: No active cardiopulmonary disease. Electronically Signed   By: Delbert Phenix M.D.   On: 02/02/2018 02:34   Ct Head Wo Contrast  Result Date: 02/01/2018 CLINICAL DATA:  Alzheimer's.  Falls EXAM: CT HEAD WITHOUT CONTRAST CT CERVICAL SPINE WITHOUT CONTRAST TECHNIQUE: Multidetector CT imaging of the head and cervical spine was performed following the standard protocol without intravenous contrast. Multiplanar CT image reconstructions of the cervical spine were also generated. COMPARISON:  01/01/2018 FINDINGS: CT HEAD FINDINGS Brain: There is atrophy and chronic small vessel disease changes. Old right frontal infarct. No acute intracranial abnormality. Specifically, no hemorrhage, hydrocephalus, mass lesion, acute infarction, or significant intracranial injury. Vascular: No hyperdense vessel or unexpected calcification. Skull: No acute calvarial abnormality. Sinuses/Orbits: Visualized paranasal sinuses and mastoids clear. Orbital soft tissues unremarkable. Other: None CT CERVICAL SPINE FINDINGS Alignment: No subluxation. Skull base and vertebrae: No acute fracture. No primary bone lesion or focal pathologic process. Soft tissues and spinal canal: No prevertebral fluid or swelling. No visible canal hematoma. Disc levels:  Diffuse  degenerative disc and facet disease. Upper chest: No acute findings Other: No acute findings IMPRESSION: Atrophy, chronic small vessel disease.  Old right frontal infarct. No acute intracranial abnormality. Diffuse cervical spondylosis.  No acute bony abnormality. Electronically Signed   By: Charlett Nose M.D.   On: 02/01/2018 22:56   Ct Cervical Spine Wo Contrast  Result Date: 02/01/2018 CLINICAL DATA:  Alzheimer's.  Falls EXAM: CT HEAD WITHOUT CONTRAST CT CERVICAL SPINE WITHOUT CONTRAST TECHNIQUE: Multidetector CT imaging of the head and cervical spine was performed following the standard protocol without intravenous contrast. Multiplanar CT image reconstructions of the cervical spine were also generated. COMPARISON:  01/01/2018 FINDINGS: CT HEAD FINDINGS Brain: There is atrophy and chronic small vessel disease changes. Old right frontal  infarct. No acute intracranial abnormality. Specifically, no hemorrhage, hydrocephalus, mass lesion, acute infarction, or significant intracranial injury. Vascular: No hyperdense vessel or unexpected calcification. Skull: No acute calvarial abnormality. Sinuses/Orbits: Visualized paranasal sinuses and mastoids clear. Orbital soft tissues unremarkable. Other: None CT CERVICAL SPINE FINDINGS Alignment: No subluxation. Skull base and vertebrae: No acute fracture. No primary bone lesion or focal pathologic process. Soft tissues and spinal canal: No prevertebral fluid or swelling. No visible canal hematoma. Disc levels:  Diffuse degenerative disc and facet disease. Upper chest: No acute findings Other: No acute findings IMPRESSION: Atrophy, chronic small vessel disease.  Old right frontal infarct. No acute intracranial abnormality. Diffuse cervical spondylosis.  No acute bony abnormality. Electronically Signed   By: Charlett Nose M.D.   On: 02/01/2018 22:56     EKG: Reviewed independently.  Sinus rhythm, bradycardia, no ischemic change..   Assessment/Plan Principal Problem:    Fall Active Problems:   Dementia with behavioral disturbance   Acute renal failure superimposed on stage 3 chronic kidney disease (HCC)   Normocytic anemia   Depression with anxiety   Fall: Etiology is not clear.  Possibly due to orthostatic status as reported by EMS.  Other differential diagnosis to include TIA/stroke.  No seizure activity.  Since patient has dementia, cannot tell if he has any metals in body, will hold off MRI of brain.  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  Patient has bradycardia with heart rate 37-87.  EKG showed normal sinus rhythm, no ischemic change.  Not sure if this bradycardia contributed to his fall. Pt is on high dose of Ativan 1 mg prn q4h at home, which may have contributed partially.  -Placed on telemetry bed for observation - PT/OT - IV fluid: 1 L normal saline, followed by 100 cc/h -Check orthostatic vital signs. - Decrease Ativan dose from 1 mg every 4 hours 0.5 mg every 6 hours  AoCKD-III: Baseline Cre is 1.0, pt's Cre is 1.36 and BUN 24 on admission. Likely due to dehydration. - IVF as above - Follow up renal function by BMP  Normocytic anemia: Hemoglobin 10.4 on 08/13/2017--> 8.9 today.  No active bleeding noted. -Check anemia panel  Depression and anxiety -Decreased Ativan dose as above -Depakote, Paxil, Seroquel  Dementia with behavior change -Namenda and olanzapine   DVT ppx:   SQ Lovenox Code Status: Full code Family Communication: None at bed side.     Disposition Plan:  Anticipate discharge back to previous SNF Consults called:  none Admission status: Obs / tele    Date of Service 02/02/2018    Lorretta Harp [facility] Pager [phone]  If 7PM-7AM, please contact night-coverage [email or web address] Password [email or web address] 02/02/2018, 6:53 AM

## 2018-02-02 NOTE — Evaluation (Signed)
Physical Therapy Evaluation Patient Details Name: Antonio Duran MRN: [id number] DOB: 08/27/1940 Today's Date: 02/02/2018   History of Present Illness  Antonio Duran is a 76 y.o. male with medical history significant of Alzheimer's disease, CKD-3, enterococcus UTI, depression, anxiety, who presents with fall, etiology unclear, but EMS reports orthostatic in the field; Comes from SNF, per H&P at baseline cognition in ED.  Clinical Impression   Patient evaluated by Physical Therapy with no further acute PT needs identified. All education has been completed and the patient has no further questions. Recommend pt be evaluated by PT once back at SNF; I am hopeful that he would be able to better participate in therapies at his SNF with familiar caregivers, routines, and environment;  See below for any follow-up Physical Therapy or equipment needs. PT is signing off. Thank you for this referral.     Follow Up Recommendations SNF    Equipment Recommendations  Other (comment)(TBD by rehab staff at SNF)    Recommendations for Other Services       Precautions / Restrictions Precautions Precautions: Fall Restrictions Weight Bearing Restrictions: No      Mobility  Bed Mobility Overal bed mobility: Needs Assistance Bed Mobility: Supine to Sit;Sit to Supine     Supine to sit: Min assist Sit to supine: Min assist   General bed mobility comments: gentle tactile cueing, including handheld assist to "lead" Antonio Duran OOB and then back to bed at end of walk  Transfers Overall transfer level: Needs assistance Equipment used: 2 person hand held assist Transfers: Sit to/from Stand Sit to Stand: Min assist;+2 safety/equipment         General transfer comment: Patient requires 2 person assist for safety. Patient is pulling at lines and catheter and had to have mitts on. Patient is impulsive and will sit down without notice.   Ambulation/Gait Ambulation/Gait assistance: +2 physical  assistance;Min assist Gait Distance (Feet): 30 Feet Assistive device: 2 person hand held assist Gait Pattern/deviations: Decreased step length - right;Decreased step length - left;Decreased stance time - right;Trunk flexed     General Gait Details: Noting hips and knees slighly flexed in stance bilaterally; impulsive, and had +2 assist for safety  Stairs            Wheelchair Mobility    Modified Rankin (Stroke Patients Only)       Balance Overall balance assessment: Needs assistance   Sitting balance-Leahy Scale: Fair       Standing balance-Leahy Scale: Fair                               Pertinent Vitals/Pain Pain Assessment: Faces Pain Score: 0-No pain Faces Pain Scale: No hurt    Home Living Family/patient expects to be discharged to:: Skilled nursing facility                 Additional Comments: Patient was at SNF when he was admitted to hospital.    Prior Function Level of Independence: Needs assistance         Comments: Per H&P required assist for ADLs, but amount of assist unknown, assume Pt. required extensive assist with ADLs secondary to patient cognive level.      Hand Dominance        Extremity/Trunk Assessment   Upper Extremity Assessment Upper Extremity Assessment: Defer to OT evaluation    Lower Extremity Assessment Lower Extremity Assessment: Difficult to assess due to impaired cognition(But enough   strength for simple mobility activities)       Communication   Communication: No difficulties  Cognition Arousal/Alertness: Awake/alert Behavior During Therapy: Restless Overall Cognitive Status: No family/caregiver present to determine baseline cognitive functioning                                 General Comments: Patient was confused and agitiated/restless  during the session. Unsure of patient cognive level at SNF      General Comments General comments (skin integrity, edema, etc.): Opted to  keep his mitts on for safety and lines    Exercises     Assessment/Plan    PT Assessment All further PT needs can be met in the next venue of care  PT Problem List Decreased strength;Decreased activity tolerance;Decreased balance;Decreased mobility;Decreased coordination;Decreased cognition;Decreased knowledge of use of DME;Decreased safety awareness;Decreased knowledge of precautions       PT Treatment Interventions      PT Goals (Current goals can be found in the Care Plan section)  Acute Rehab PT Goals Patient Stated Goal: None stated PT Goal Formulation: All assessment and education complete, DC therapy    Frequency     Barriers to discharge        Co-evaluation PT/OT/SLP Co-Evaluation/Treatment: Yes Reason for Co-Treatment: Necessary to address cognition/behavior during functional activity;For patient/therapist safety PT goals addressed during session: Mobility/safety with mobility OT goals addressed during session: ADL's and self-care       AM-PAC PT "6 Clicks" Daily Activity  Outcome Measure Difficulty turning over in bed (including adjusting bedclothes, sheets and blankets)?: None Difficulty moving from lying on back to sitting on the side of the bed? : A Little Difficulty sitting down on and standing up from a chair with arms (e.g., wheelchair, bedside commode, etc,.)?: A Little Help needed moving to and from a bed to chair (including a wheelchair)?: A Little Help needed walking in hospital room?: A Little Help needed climbing 3-5 steps with a railing? : A Lot 6 Click Score: 18    End of Session Equipment Utilized During Treatment: Gait belt Activity Tolerance: Patient tolerated treatment well Patient left: in bed;with nursing/sitter in room;with call bell/phone within reach Nurse Communication: Mobility status PT Visit Diagnosis: Unsteadiness on feet (R26.81);Other abnormalities of gait and mobility (R26.89);Muscle weakness (generalized) (M62.81)    Time:  1859-0931 PT Time Calculation (min) (ACUTE ONLY): 15 min   Charges:   PT Evaluation $PT Eval Low Complexity: [place], PT  Acute Rehabilitation Services Pager [phone] Office [phone]   Antonio Duran 02/02/2018, 1:09 PM

## 2018-02-03 DIAGNOSIS — N179 Acute kidney failure, unspecified: Secondary | ICD-10-CM | POA: Diagnosis not present

## 2018-02-03 DIAGNOSIS — W19XXXA Unspecified fall, initial encounter: Secondary | ICD-10-CM | POA: Diagnosis not present

## 2018-02-03 NOTE — Discharge Summary (Signed)
Antonio Duran ZOX:[id number] DOB: 01/12/1941 DOA: 02/01/2018  PCP: Uvaldo BristleKurth-Bowen, [name], PA-C  Admit date: 02/01/2018 Discharge date: 02/03/2018  Time spent: 65 minutes  Recommendations for Outpatient Follow-up:  1. Monitor BP daily for 14 days to evaluate for HTN 2. If patient continues to fall and is not orthostatic consider changing Ativan dose 3. [facility]. Recommend bmet 2 days evaluate potassium level. Monitor BP as noted above. Monitor for hydration/thirst. Recommend bmet to follow potassium level.  Follow up with PCP 1 week evaluation of orthostatic hypotension 4. No changes in medications   Discharge Diagnoses:  Principal Problem:   Fall Active Problems:   Acute renal failure superimposed on stage 3 chronic kidney disease (HCC)   Dementia with behavioral disturbance   Normocytic anemia   Hypokalemia   Depression with anxiety   Discharge Condition: stable at baseline  Diet recommendation: heart healthy  There were no vitals filed for this visit.  History of present illness:  Patient with hx alzheimer's, CKD 3, UTI, anxiety anddementia presented to ED on 9/23 cc falls.Per report,patient's mental status  at his normal baseline. Pt was reportedly to have 3 falls in 30 min .He was noted to be orthostatic with EMS.Per EDP, on arrival, patient was agitated, and received 0.5 mg of Ativan. In ED, he was sleepy, but arousable. He knew his own name, but not oriented to place and time.He denied any pain anywhere. He moved all extremities. No facial droop or slurred speech. No active respiratory distress, cough, nausea, vomiting noted.  Hospital Course:  Fall: Likely  to orthostatic hypotension. Per EMS patient orthostatic in field. Chart review indicates BP 124/62 upon arrival and dropped to 98/58 when standing. No seizure activity. 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 Patient had  bradycardia with heart rate 49-87.EKG showed normal sinus rhythm, no ischemic change.  Pt is on high dose of Ativan 1 mg prn q4h at home, which may havecontributed partially. No events on tele. He received IV fluids and at discharge no longer orthostatic. No focal neuro defecits. No metabolic derangement. If continues to fall without being orthostatic consider adjusting Ativan.  AoCKD-III: Baseline Cre is1.0, pt's Cre is1.36 and BUN 24on admission. Likely due to dehydration. Resolved at discharge  Normocytic anemia:Hemoglobin 10.4 on 08/13/2017-->8.9 today. No active bleeding noted. -Check anemia panel  Depressionand anxiety -patient agitated initially. Resume ativan home dose  -Depakote, Paxil, Seroquel  Dementiawithbehavior change -Namendaand olanzapine  Procedures:    Consultations:  none  Discharge Exam:     Vitals:   02/02/18 0417 02/02/18 1011  BP: (!) 150/103 (!) 115/104  Pulse: (!) 49 89  Resp: 18   Temp:    SpO2: 100%     General: sitting up in bed being fed breakfast and eating. Smiling. Mitts on.  Cardiovascular: rrr no mgr no LE edema Respiratory: normal effort BS clear bilaterally no wheeze Neuro: alert oriented to self only. Attempts to follow commands. Quite verbal but unable to make wants and needs known. Moving all extremities pleasant but uncooperative  Discharge Instructions       Discharge Instructions    Call MD for:  persistant dizziness or light-headedness   Complete by:  As directed    Diet - low sodium heart healthy   Complete by:  As directed    Discharge instructions   Complete by:  As directed    Monitor and document BP daily for 2 weeks. Monitor for hydration/thirs If falls  persist without patient being orthostatic, consider modifying Ativan dose   Increase activity slowly   Complete by:  As directed           Allergies as of 02/02/2018      Reactions   Zolpidem Other (See Comments)    Reaction:  Causes pt to sleep walk  Sleep walking         Medication List    TAKE these medications   acetaminophen 500 MG tablet Commonly known as:  TYLENOL Take 500 mg by mouth every 4 (four) hours as needed for mild pain, moderate pain, fever or headache.   albuterol (2.5 MG/3ML) 0.083% nebulizer solution Commonly known as:  PROVENTIL Take 2.5 mg by nebulization every 6 (six) hours as needed for wheezing or shortness of breath.   bisacodyl 10 MG suppository Commonly known as:  DULCOLAX Place 10 mg rectally daily as needed for moderate constipation.   divalproex 125 MG capsule Commonly known as:  DEPAKOTE SPRINKLE Take 250 mg by mouth 3 (three) times daily.   estradiol 0.5 MG tablet Commonly known as:  ESTRACE Take 0.5 mg by mouth daily.   guaifenesin 100 MG/5ML syrup Commonly known as:  ROBITUSSIN Take 200 mg by mouth every 6 (six) hours as needed for cough.   loperamide 2 MG capsule Commonly known as:  IMODIUM Take 2 mg by mouth as needed for diarrhea or loose stools.   LORazepam 1 MG tablet Commonly known as:  ATIVAN Take 1 tablet (1 mg total) by mouth every 4 (four) hours as needed for anxiety or sedation.   magnesium hydroxide 400 MG/5ML suspension Commonly known as:  MILK OF MAGNESIA Take 30 mLs by mouth at bedtime as needed for mild constipation.   Melatonin 5 MG Tabs Take 1 tablet by mouth every evening.   MINTOX 200-200-20 MG/5ML suspension Generic drug:  alum & mag hydroxide-simeth Take 30 mLs by mouth every 6 (six) hours as needed for indigestion or heartburn.   NAMENDA XR 28 MG Cp24 24 hr capsule Generic drug:  memantine Take 28 mg by mouth daily.   neomycin-bacitracin-polymyxin [phone] ointment Apply 1 application topically 4 (four) times daily as needed (skin tears).   NUTRITIONAL SHAKE PO Take 237 mLs by mouth 3 (three) times daily. Mighty Shakes   OLANZapine 5 MG tablet Commonly known as:  ZYPREXA Take 1 tablet (5 mg  total) by mouth every 8 (eight) hours as needed (for aggitation).   PARoxetine 20 MG tablet Commonly known as:  PAXIL Take 1 tablet (20 mg total) by mouth at bedtime.   QUEtiapine 25 MG tablet Commonly known as:  SEROQUEL Take 25 mg by mouth daily at 2 PM.   QUEtiapine 100 MG tablet Commonly known as:  SEROQUEL Take 1 tablet (100 mg total) by mouth at bedtime.   sennosides-docusate sodium 8.6-50 MG tablet Commonly known as:  SENOKOT-S Take 2 tablets by mouth daily.   triamcinolone cream 0.5 % Commonly known as:  KENALOG Apply 1 application topically 2 (two) times daily.           Allergies  Allergen Reactions  . Zolpidem Other (See Comments)    Reaction:  Causes pt to sleep walk  Sleep walking      The results of significant diagnostics from this hospitalization (including imaging, microbiology, ancillary and laboratory) are listed below for reference.    Significant Diagnostic Studies:  ImagingResults  Dg Chest 2 View  Result Date: 02/02/2018 CLINICAL DATA:  Fall, altered mental status EXAM:  CHEST - 2 VIEW COMPARISON:  08/09/2017 chest radiograph. FINDINGS: Stable cardiomediastinal silhouette with normal heart size. No pneumothorax. No pleural effusion. Lungs appear clear, with no acute consolidative airspace disease and no pulmonary edema. No displaced fractures. Scattered healed right rib deformities. IMPRESSION: No active cardiopulmonary disease. Electronically Signed   By: Delbert Phenix M.D.   On: 02/02/2018 02:34   Ct Head Wo Contrast  Result Date: 02/01/2018 CLINICAL DATA:  Alzheimer's.  Falls EXAM: CT HEAD WITHOUT CONTRAST CT CERVICAL SPINE WITHOUT CONTRAST TECHNIQUE: Multidetector CT imaging of the head and cervical spine was performed following the standard protocol without intravenous contrast. Multiplanar CT image reconstructions of the cervical spine were also generated. COMPARISON:  01/01/2018 FINDINGS: CT HEAD FINDINGS Brain: There is  atrophy and chronic small vessel disease changes. Old right frontal infarct. No acute intracranial abnormality. Specifically, no hemorrhage, hydrocephalus, mass lesion, acute infarction, or significant intracranial injury. Vascular: No hyperdense vessel or unexpected calcification. Skull: No acute calvarial abnormality. Sinuses/Orbits: Visualized paranasal sinuses and mastoids clear. Orbital soft tissues unremarkable. Other: None CT CERVICAL SPINE FINDINGS Alignment: No subluxation. Skull base and vertebrae: No acute fracture. No primary bone lesion or focal pathologic process. Soft tissues and spinal canal: No prevertebral fluid or swelling. No visible canal hematoma. Disc levels:  Diffuse degenerative disc and facet disease. Upper chest: No acute findings Other: No acute findings IMPRESSION: Atrophy, chronic small vessel disease.  Old right frontal infarct. No acute intracranial abnormality. Diffuse cervical spondylosis.  No acute bony abnormality. Electronically Signed   By: Charlett Nose M.D.   On: 02/01/2018 22:56   Ct Cervical Spine Wo Contrast  Result Date: 02/01/2018 CLINICAL DATA:  Alzheimer's.  Falls EXAM: CT HEAD WITHOUT CONTRAST CT CERVICAL SPINE WITHOUT CONTRAST TECHNIQUE: Multidetector CT imaging of the head and cervical spine was performed following the standard protocol without intravenous contrast. Multiplanar CT image reconstructions of the cervical spine were also generated. COMPARISON:  01/01/2018 FINDINGS: CT HEAD FINDINGS Brain: There is atrophy and chronic small vessel disease changes. Old right frontal infarct. No acute intracranial abnormality. Specifically, no hemorrhage, hydrocephalus, mass lesion, acute infarction, or significant intracranial injury. Vascular: No hyperdense vessel or unexpected calcification. Skull: No acute calvarial abnormality. Sinuses/Orbits: Visualized paranasal sinuses and mastoids clear. Orbital soft tissues unremarkable. Other: None CT CERVICAL SPINE FINDINGS  Alignment: No subluxation. Skull base and vertebrae: No acute fracture. No primary bone lesion or focal pathologic process. Soft tissues and spinal canal: No prevertebral fluid or swelling. No visible canal hematoma. Disc levels:  Diffuse degenerative disc and facet disease. Upper chest: No acute findings Other: No acute findings IMPRESSION: Atrophy, chronic small vessel disease.  Old right frontal infarct. No acute intracranial abnormality. Diffuse cervical spondylosis.  No acute bony abnormality. Electronically Signed   By: Charlett Nose M.D.   On: 02/01/2018 22:56     Microbiology:        Recent Results (from the past 240 hour(s))  MRSA PCR Screening     Status: None   Collection Time: 02/02/18  8:10 AM  Result Value Ref Range Status   MRSA by PCR NEGATIVE NEGATIVE Final    Comment:        The GeneXpert MRSA Assay (FDA approved for NASAL specimens only), is one component of a comprehensive MRSA colonization surveillance program. It is not intended to diagnose MRSA infection nor to guide or monitor treatment for MRSA infections. Performed at [facility], [address] [place], [place] [address]  Labs: Basic Metabolic Panel: LastLabs      Recent Labs  Lab 02/01/18 1841 02/02/18 0804  NA 144 145  K 3.7 3.1*  CL 115* 122*  CO2 20* 20*  GLUCOSE 115* 73  BUN 24* 12  CREATININE 1.36* 0.80  CALCIUM 8.0* 6.6*     Liver Function Tests: LastLabs     Recent Labs  Lab 02/01/18 1841  AST 14*  ALT 12  ALKPHOS 50  BILITOT 0.3  PROT 5.4*  ALBUMIN 2.6*     LastLabs  No results for input(s): LIPASE, AMYLASE in the last 168 hours.   LastLabs  No results for input(s): AMMONIA in the last 168 hours.   CBC: LastLabs  Recent Labs  Lab 02/01/18 1841 02/02/18 0804  WBC 4.6 4.2  NEUTROABS 2.7  --   HGB 8.9* 9.0*  HCT 29.0* 29.1*  MCV 95.1 95.1  PLT 184 181     Cardiac Enzymes: LastLabs  No results for input(s): CKTOTAL, CKMB,  CKMBINDEX, TROPONINI in the last 168 hours.   BNP: BNP (last 3 results) RecentLabs(withinlast365days)  No results for input(s): BNP in the last 8760 hours.    ProBNP (last 3 results) RecentLabs(withinlast365days)  No results for input(s): PROBNP in the last 8760 hours.    CBG: LastLabs  No results for input(s): GLUCAP in the last 168 hours.       Signed:  Gwenyth Bender MD.  [facility] 02/02/2018, 1:04 PM           Cosigned by: Lahoma Crocker, MD at 02/02/2018 2:37 PM  Revision History              Routing History

## 2018-02-03 NOTE — Clinical Social Work Note (Signed)
Patient [facility] today, transported by ambulance. Discharge clinicals transmitted to facility and son contacted that transport called. CSW signing off as no other SW intervention services needed.  Genelle BalVanessa Aravind Chrismer, MSW, LCSW Licensed Clinical Social Worker Clinical Social Work Department [facility] [phone]

## 2018-02-03 NOTE — NC FL2 (Addendum)
MEDICAID FL2 LEVEL OF CARE SCREENING TOOL     IDENTIFICATION  Patient Name: Antonio Duran Birthdate: 1940-11-21 Sex: male Admission Date (Current Location): 02/01/2018  [facility] and [place] Number:  Producer, television/film/video and Address:  [facility], [address] [place], [place] [address]      Provider Number: [id number]  Attending Physician Name and Address:  Lahoma Crocker, MD  Relative Name and Phone Number:  Jerame Hedding - son, [phone]    Current Level of Care: Hospital Recommended Level of Care: Assisted Living Facility([facility]) Prior Approval Number:    Date Approved/Denied:   PASRR Number:    Discharge Plan: Other (Comment)(ALF)    Current Diagnoses: Patient Active Problem List   Diagnosis Date Noted  . Fall 02/02/2018  . 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 Hypokalemia 02/02/2018  . UTI (urinary tract infection) 08/10/2017  . HCAP (healthcare-associated pneumonia) 08/10/2017  . Sepsis, unspecified organism (HCC) 08/09/2017  . Acute renal failure superimposed on stage 3 chronic kidney disease (HCC) 08/09/2017  . Sepsis (HCC) 08/09/2017  . Normocytic anemia   . Lung nodule, solitary   . Mixed dementia   . Palliative care by specialist   . Agitation   . Insomnia   . Enterococcus UTI 01/18/2016  . Confusion 01/18/2016  . Dementia with behavioral disturbance 01/18/2016    Orientation RESPIRATION BLADDER Height & Weight     Self  Normal Incontinent, External catheter(Condom cath utilized during hospital stay) Weight:   Height:     BEHAVIORAL SYMPTOMS/MOOD NEUROLOGICAL BOWEL NUTRITION STATUS      Continent Diet(Regular) Chopped Meat  AMBULATORY STATUS COMMUNICATION OF NEEDS Skin   Limited Assist Verbally Skin abrasions(Skin tear, right elbow wth foam dressig)                       Personal Care Assistance Level of Assistance  Bathing, Feeding, Dressing Bathing Assistance: Maximum  assistance Feeding assistance: Maximum assistance Dressing Assistance: Maximum assistance     Functional Limitations Info  Sight, Hearing, Speech Sight Info: Adequate Hearing Info: Adequate Speech Info: Adequate    SPECIAL CARE FACTORS FREQUENCY  PT (By licensed PT), OT (By licensed OT)     PT Frequency: Evaluated 9/23 OT Frequency: Evaluated 9/23            Contractures Contractures Info: Not present    Additional Factors Info  Code Status, Allergies Code Status Info: Full Allergies Info: Zolpidem           Current Medications (02/03/2018):  This is the current hospital active medication list Current Facility-Administered Medications  Medication Dose Route Frequency Provider Last Rate Last Dose  . 0.9 %  sodium chloride infusion   Intravenous Continuous Gwenyth Bender, NP 10 mL/hr at 02/02/18 0948    . acetaminophen (TYLENOL) tablet 500 mg  500 mg Oral Q4H PRN Lorretta Harp, MD      . albuterol (PROVENTIL) (2.5 MG/3ML) 0.083% nebulizer solution 2.5 mg  2.5 mg Nebulization Q4H PRN Lorretta Harp, MD      . alum & mag hydroxide-simeth (MAALOX/MYLANTA) 200-200-20 MG/5ML suspension 30 mL  30 mL Oral Q6H PRN Lorretta Harp, MD      . bisacodyl (DULCOLAX) suppository 10 mg  10 mg Rectal Daily PRN Lorretta Harp, MD      . divalproex (DEPAKOTE SPRINKLE) capsule 250 mg  250 mg Oral TID Lorretta Harp, MD   250 mg at 02/03/18 1105  . enoxaparin (  LOVENOX) injection 40 mg  40 mg Subcutaneous Q24H Lorretta Harp, MD   40 mg at 02/02/18 1636  . estradiol (ESTRACE) tablet 0.5 mg  0.5 mg Oral Daily Lorretta Harp, MD   0.5 mg at 02/03/18 1105  . feeding supplement (GLUCERNA SHAKE) (GLUCERNA SHAKE) liquid   Oral TID Lorretta Harp, MD   237 mL at 02/02/18 1636  . hydrALAZINE (APRESOLINE) injection 5 mg  5 mg Intravenous Q4H PRN Gwenyth Bender, NP      . loperamide (IMODIUM) capsule 2 mg  2 mg Oral PRN Lorretta Harp, MD      . LORazepam (ATIVAN) tablet 1 mg  1 mg Oral Q4H PRN Gwenyth Bender, NP   1 mg at 02/02/18 1637   . magnesium hydroxide (MILK OF MAGNESIA) suspension 30 mL  30 mL Oral QHS PRN Lorretta Harp, MD      . Melatonin TABS 6 mg  6 mg Oral QHS Lorretta Harp, MD   6 mg at 02/02/18 2148  . memantine (NAMENDA XR) 24 hr capsule 28 mg  28 mg Oral Daily Lorretta Harp, MD   28 mg at 02/03/18 1104  . OLANZapine (ZYPREXA) tablet 5 mg  5 mg Oral Q8H PRN Lorretta Harp, MD      . ondansetron [facility]) tablet 4 mg  4 mg Oral Q6H PRN Lorretta Harp, MD       Or  . ondansetron [facility]) injection 4 mg  4 mg Intravenous Q6H PRN Lorretta Harp, MD      . PARoxetine (PAXIL) tablet 20 mg  20 mg Oral QHS Lorretta Harp, MD   20 mg at 02/02/18 2148  . QUEtiapine (SEROQUEL) tablet 100 mg  100 mg Oral QHS Lorretta Harp, MD   100 mg at 02/02/18 2148  . QUEtiapine (SEROQUEL) tablet 25 mg  25 mg Oral Q1400 Lorretta Harp, MD   25 mg at 02/02/18 1318  . senna-docusate (Senokot-S) tablet 2 tablet  2 tablet Oral Daily Lorretta Harp, MD   2 tablet at 02/03/18 1104  . triamcinolone cream (KENALOG) 0.5 % 1 application  1 application Topical BID Lorretta Harp, MD   1 application at 02/03/18 1106     Discharge Medications: Please see discharge summary for a list of discharge medications.  Relevant Imaging Results:  Relevant Lab Results:   Additional Information ss#[id number].  DISCHARGE MEDICATIONS   TAKE these medications  acetaminophen500 MG tablet Commonly known as: TYLENOL Take 500 mg by mouth every 4 (four) hours as needed for mild pain, moderate pain, fever or headache.   albuterol(2.5 MG/3ML) 0.083% nebulizer solution Commonly known as: PROVENTIL Take 2.5 mg by nebulization every 6 (six) hours as needed for wheezing or shortness of breath.   bisacodyl10 MG suppository Commonly known as: DULCOLAX Place 10 mg rectally daily as needed for moderate constipation.   divalproex125 MG capsule Commonly known as: DEPAKOTE SPRINKLE Take 250 mg by mouth 3 (three) times daily.   estradiol0.5 MG tablet Commonly known as: ESTRACE Take 0.5  mg by mouth daily.   guaifenesin100 MG/5ML syrup Commonly known as: ROBITUSSIN Take 200 mg by mouth every 6 (six) hours as needed for cough.   loperamide2 MG capsule Commonly known as: IMODIUM Take 2 mg by mouth as needed for diarrhea or loose stools.   LORazepam1 MG tablet Commonly known as: ATIVAN Take 1 tablet (1 mg total) by mouth every 4 (four) hours as needed for anxiety or sedation.   magnesium hydroxide400 MG/5ML suspension Commonly known as: MILK OF  MAGNESIA Take 30 mLs by mouth at bedtime as needed for mild constipation.   Melatonin5 MG Tabs Take 1 tablet by mouth every evening.   MINTOX200-200-20 MG/5ML suspension Generic drug: alum &mag hydroxide-simeth Take 30 mLs by mouth every 6 (six) hours as needed for indigestion or heartburn.   NAMENDA XR28 MG Cp24 24 hr capsule Generic drug: memantine Take 28 mg by mouth daily.   neomycin-bacitracin-[phone] ointment Apply 1 application topically 4 (four) times daily as needed (skin tears).   NUTRITIONAL SHAKE PO Take 237 mLs by mouth 3 (three) times daily. Mighty Shakes   OLANZapine5 MG tablet Commonly known as: ZYPREXA Take 1 tablet (5 mg total) by mouth every 8 (eight) hours as needed (for aggitation).   PARoxetine20 MG tablet Commonly known as: PAXIL Take 1 tablet (20 mg total) by mouth at bedtime.   QUEtiapine25 MG tablet Commonly known as: SEROQUEL Take 25 mg by mouth daily at 2 PM.   QUEtiapine100 MG tablet Commonly known as: SEROQUEL Take 1 tablet (100 mg total) by mouth at bedtime.   sennosides-docusate sodium8.6-50 MG tablet Commonly known as: SENOKOT-S Take 2 tablets by mouth daily.   triamcinolone cream0.5 % Commonly known as: KENALOG Apply 1 application topically 2 (two) times daily.      Cristobal Goldmannrawford, Cedric Denison Bradley, LCSW

## 2018-02-03 NOTE — Progress Notes (Signed)
Pt discharged to facility via ambulance. Pt is hemodynamically stable. Report given to transporters. Report given to [place] [facility]. Dondra SpryMoore, Yehuda Printup Islee, RN

## 2018-02-03 NOTE — Clinical Social Work Note (Signed)
Clinical Social Work Assessment  Patient Details  Name: Antonio Duran MRN: [id number] Date of Birth: 08/13/1940  Date of referral:  02/03/18               Reason for consult:  Discharge Planning                Permission sought to share information with:  Family Supports Permission granted to share information::  No(Patient oriented to self only)  Name::     Eula FriedSonny Topham  Agency::     Relationship::  Son  Contact Information:  [phone]  Housing/Transportation Living arrangements for the past 2 months:  Assisted Living Facility([facility]) Source of Information:  Adult Children Patient Interpreter Needed:  None Criminal Activity/Legal Involvement Pertinent to Current Situation/Hospitalization:  No - Comment as needed Significant Relationships:  Adult Children Lives with:  Facility Resident(ALF) Do you feel safe going back to the place where you live?  No Need for family participation in patient care:  Yes (Comment)  Care giving concerns: Son expressed no concerns regarding his dad's care at ALF.  Social Worker assessment / plan:  CSW talked with son by phone regarding patient's readiness for discharge and confirmed that patient will return. When asked, son replied that his dad had been at the facility for 5 plus years. Son explained that his mom died 5 years ago and his dad was living alone and he and his wife checked on him, then they would have him stay with them some, and they finally transitioned him to the ALF when it was clear that he could no longer live on his own.   Employment status:  Retired Network engineernsurance information:  Managed Medicare([employer]) PT Recommendations:  Home with Home Health(Back to ALF - OT/PT mistakenly indicated back to SNF in their notes, ) Information / Referral to community resources:  Other (Comment Required)(None needed or requested by son as patient returning to ALF)  Patient/Family's Response to care:  Son expressed no concerns regarding  patient's care during hospitalization.   Patient/Family's Understanding of and Emotional Response to Diagnosis, Current Treatment, and Prognosis:  Son expressed understanding of patient's need for more care and ALF placement. Son also asked questions regarding patient's hospitalization and requested to talk with patient's nurse.  Emotional Assessment Appearance:  Appears stated age Attitude/Demeanor/Rapport:  Other(Appropriate) Affect (typically observed):  Appropriate Orientation:  Oriented to Self Alcohol / Substance use:  Tobacco Use, Alcohol Use, Illicit Drugs(Per H&P, patient quit smoking and does not drink or use illicit drugs) Psych involvement (Current and /or in the community):  No (Comment)  Discharge Needs  Concerns to be addressed:  Discharge Planning Concerns Readmission within the last 30 days:  No Current discharge risk:  None Barriers to Discharge:  No Barriers Identified   Cristobal GoldmannCrawford, Gladis Soley Bradley, LCSW 02/03/2018, 12:53 PM

## 2018-02-04 LAB — URINE CULTURE

## 2018-02-05 ENCOUNTER — Other Ambulatory Visit: Payer: Self-pay

## 2018-02-05 ENCOUNTER — Emergency Department (HOSPITAL_COMMUNITY): Payer: Medicare HMO

## 2018-02-05 ENCOUNTER — Encounter (HOSPITAL_COMMUNITY): Payer: Self-pay

## 2018-02-05 ENCOUNTER — Emergency Department (HOSPITAL_COMMUNITY)
Admission: EM | Admit: 2018-02-05 | Discharge: 2018-02-05 | Disposition: A | Discharge status: Home / Self Care | Payer: Medicare HMO | Attending: Emergency Medicine | Admitting: Emergency Medicine

## 2018-02-05 DIAGNOSIS — F0281 Dementia in other diseases classified elsewhere with behavioral disturbance: Secondary | ICD-10-CM | POA: Insufficient documentation

## 2018-02-05 DIAGNOSIS — Z87891 Personal history of nicotine dependence: Secondary | ICD-10-CM | POA: Diagnosis not present

## 2018-02-05 DIAGNOSIS — S51001A Unspecified open wound of right elbow, initial encounter: Secondary | ICD-10-CM | POA: Diagnosis not present

## 2018-02-05 DIAGNOSIS — R296 Repeated falls: Secondary | ICD-10-CM | POA: Insufficient documentation

## 2018-02-05 DIAGNOSIS — N183 Chronic kidney disease, stage 3 (moderate): Secondary | ICD-10-CM | POA: Diagnosis not present

## 2018-02-05 DIAGNOSIS — Y999 Unspecified external cause status: Secondary | ICD-10-CM | POA: Insufficient documentation

## 2018-02-05 DIAGNOSIS — Z79899 Other long term (current) drug therapy: Secondary | ICD-10-CM | POA: Diagnosis not present

## 2018-02-05 DIAGNOSIS — R451 Restlessness and agitation: Secondary | ICD-10-CM | POA: Diagnosis not present

## 2018-02-05 DIAGNOSIS — S59901A Unspecified injury of right elbow, initial encounter: Secondary | ICD-10-CM | POA: Diagnosis present

## 2018-02-05 DIAGNOSIS — Y92129 Unspecified place in nursing home as the place of occurrence of the external cause: Secondary | ICD-10-CM | POA: Diagnosis not present

## 2018-02-05 DIAGNOSIS — W19XXXA Unspecified fall, initial encounter: Secondary | ICD-10-CM | POA: Diagnosis not present

## 2018-02-05 DIAGNOSIS — Y939 Activity, unspecified: Secondary | ICD-10-CM | POA: Diagnosis not present

## 2018-02-05 DIAGNOSIS — G309 Alzheimer's disease, unspecified: Secondary | ICD-10-CM | POA: Diagnosis not present

## 2018-02-05 NOTE — ED Provider Notes (Signed)
[facility] EMERGENCY DEPARTMENT Provider Note   CSN: [id number] Arrival date & time: 02/05/18  2200     History   Chief Complaint Chief Complaint  Patient presents with  . Fall    HPI Antonio Duran is a 77 y.o. male.  The history is provided by the nursing home and the EMS personnel.     LEVEL V CAVEAT:  DEMENTIA  77 y.o. M with hx of alzheimer's dementia, frequent falls, presenting to the ED for falls.  Patient seen here on 02/01/18 for same, admitted for 2 days and discharged on 02/03/18 without further findings aside from orthostatic hypotension that resolved with IVF.  Returns here today for same.  Per facility, no acute changes in his overall mental status, fevers, or other recent illness.  Apparently tonight patient fell about 3x in 30 minutes, afterwards was up and throwing patio furniture at the staff.  He was given 2mg  ativan by facility.  Patient is drowsy.  Not currently on anticoagulation.  He has skin tear to right elbow that is apparently old from prior fall but re-opened tonight.  Past Medical History:  Diagnosis Date  . AKI (acute kidney injury) (HCC)   . Alzheimer's dementia   . Enterococcus UTI   . Fall   . Normocytic anemia     Patient Active Problem List   Diagnosis Date Noted  . Fall 02/02/2018  . 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 Hypokalemia 02/02/2018  . UTI (urinary tract infection) 08/10/2017  . HCAP (healthcare-associated pneumonia) 08/10/2017  . Sepsis, unspecified organism (HCC) 08/09/2017  . Acute renal failure superimposed on stage 3 chronic kidney disease (HCC) 08/09/2017  . Sepsis (HCC) 08/09/2017  . Normocytic anemia   . Lung nodule, solitary   . Mixed dementia   . Palliative care by specialist   . Agitation   . Insomnia   . Enterococcus UTI 01/18/2016  . Confusion 01/18/2016  . Dementia with behavioral disturbance 01/18/2016    History reviewed. No pertinent surgical history.      Home Medications      Prior to Admission medications   Medication Sig Start Date End Date Taking? Authorizing Provider  acetaminophen (TYLENOL) 500 MG tablet Take 500 mg by mouth every 4 (four) hours as needed for mild pain, moderate pain, fever or headache.     [provider]  albuterol (PROVENTIL) (2.5 MG/3ML) 0.083% nebulizer solution Take 2.5 mg by nebulization every 6 (six) hours as needed for wheezing or shortness of breath.    [provider]  alum & mag hydroxide-simeth (MINTOX) 200-200-20 MG/5ML suspension Take 30 mLs by mouth every 6 (six) hours as needed for indigestion or heartburn.    [provider]  bisacodyl (DULCOLAX) 10 MG suppository Place 10 mg rectally daily as needed for moderate constipation.    [provider]  divalproex (DEPAKOTE SPRINKLE) 125 MG capsule Take 250 mg by mouth 3 (three) times daily.     [provider]  estradiol (ESTRACE) 0.5 MG tablet Take 0.5 mg by mouth daily.    [provider]  guaifenesin (ROBITUSSIN) 100 MG/5ML syrup Take 200 mg by mouth every 6 (six) hours as needed for cough.    [provider]  loperamide (IMODIUM) 2 MG capsule Take 2 mg by mouth as needed for diarrhea or loose stools.    [provider]  LORazepam (ATIVAN) 1 MG tablet Take 1 tablet (1 mg total) by mouth every 4 (four) hours as needed for anxiety  or sedation. 07/30/17   Charm Rings, NP  magnesium hydroxide (MILK OF MAGNESIA) 400 MG/5ML suspension Take 30 mLs by mouth at bedtime as needed for mild constipation.    [provider]  Melatonin 5 MG TABS Take 1 tablet by mouth every evening.    [provider]  memantine (NAMENDA XR) 28 MG CP24 24 hr capsule Take 28 mg by mouth daily.     [provider]  neomycin-bacitracin-polymyxin (NEOSPORIN) [phone] ointment Apply 1 application topically 4 (four) times daily as needed (skin tears).     [provider]  Nutritional Supplements (NUTRITIONAL  SHAKE PO) Take 237 mLs by mouth 3 (three) times daily. Mighty Shakes    [provider]  OLANZapine (ZYPREXA) 5 MG tablet Take 1 tablet (5 mg total) by mouth every 8 (eight) hours as needed (for aggitation). 07/30/17   Gwyneth Sprout, MD  PARoxetine (PAXIL) 20 MG tablet Take 1 tablet (20 mg total) by mouth at bedtime. 07/30/17   Charm Rings, NP  QUEtiapine (SEROQUEL) 100 MG tablet Take 1 tablet (100 mg total) by mouth at bedtime. 07/30/17   Charm Rings, NP  QUEtiapine (SEROQUEL) 25 MG tablet Take 25 mg by mouth daily at 2 PM.     [provider]  sennosides-docusate sodium (SENOKOT-S) 8.6-50 MG tablet Take 2 tablets by mouth daily.    [provider]  triamcinolone cream (KENALOG) 0.5 % Apply 1 application topically 2 (two) times daily.    [provider]    Family History Family History  Problem Relation Age of Onset  . Dementia Sister     Social History Social History   Tobacco Use  . Smoking status: Former Games developer  . Smokeless tobacco: Never Used  Substance Use Topics  . Alcohol use: No  . Drug use: No     Allergies   Zolpidem   Review of Systems Review of Systems  Unable to perform ROS: Other     Physical Exam Updated Vital Signs BP (!) 140/95   Pulse 82   Temp 97.9 F (36.6 C) (Axillary)   Resp 18   Wt 67 kg   SpO2 100%   BMI 21.81 kg/m   Physical Exam  Constitutional: He appears well-developed and well-nourished.  elderly  HENT:  Head: Normocephalic and atraumatic.  Mouth/Throat: Oropharynx is clear and moist.  No scalp hematoma, open wound, or skull depression  Eyes: Pupils are equal, round, and reactive to light. Conjunctivae and EOM are normal.  Neck:  c-collar in place  Cardiovascular: Normal rate, regular rhythm and normal heart sounds.  Pulmonary/Chest: Effort normal and breath sounds normal.  Abdominal: Soft. Bowel sounds are normal.  Musculoskeletal: Normal range of motion.  Small skin tear to  right elbow that is bandaged, no active bleeding, no bony deformities, no bruising  Neurological:  Drowsy  Skin: Skin is warm and dry.  Nursing note and vitals reviewed.    ED Treatments / Results  Labs (all labs ordered are listed, but only abnormal results are displayed) Labs Reviewed - No data to display  EKG None  Radiology Ct Head Wo Contrast  Result Date: 02/05/2018 CLINICAL DATA:  Headache status post multiple falls. History of Alzheimer's. EXAM: CT HEAD WITHOUT CONTRAST CT CERVICAL SPINE WITHOUT CONTRAST TECHNIQUE: Multidetector CT imaging of the head and cervical spine was performed following the standard protocol without intravenous contrast. Multiplanar CT image reconstructions of the cervical spine were also generated. COMPARISON:  02/01/2018 FINDINGS: CT HEAD  FINDINGS BRAIN: There is a moderate degree of sulcal and ventricular prominence consistent with superficial and central atrophy. No intraparenchymal hemorrhage, mass effect nor midline shift. Moderate-to-marked periventricular and subcortical white matter hypodensities consistent with chronic small vessel ischemic disease are redemonstrated. 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 No acute large vascular territory infarcts. No abnormal extra-axial fluid collections. Basal cisterns are not effaced and midline. VASCULAR: Moderate calcific atherosclerosis of the carotid siphons. SKULL: No skull fracture. No significant scalp soft tissue swelling. SINUSES/ORBITS: The mastoid air-cells are clear. The included paranasal sinuses are well-aerated.The included ocular globes and orbital contents are non-suspicious. OTHER: None. CT CERVICAL SPINE FINDINGS Alignment: Maintained cervical lordosis with 3 mm of retrolisthesis of C3 on C4 likely degenerative in etiology. Skull base and vertebrae: No acute fracture. No primary bone lesion or focal pathologic process. Soft tissues and spinal canal: No prevertebral fluid or swelling.  No visible canal hematoma. Disc levels: Marked disc flattening C3 through C7 with small posterior marginal osteophytes. No jumped or perched facets. No significant central or foraminal stenosis. Upper chest: Negative. Other: None IMPRESSION: Marked cerebral atrophy similar to prior with chronic moderate to marked small vessel ischemic disease and old right frontal lobe infarct. No acute intracranial abnormality. Cervical spondylosis without acute osseous abnormality. Electronically Signed   By: Tollie Eth M.D.   On: 02/05/2018 22:48   Ct Cervical Spine Wo Contrast  Result Date: 02/05/2018 CLINICAL DATA:  Headache status post multiple falls. History of Alzheimer's. EXAM: CT HEAD WITHOUT CONTRAST CT CERVICAL SPINE WITHOUT CONTRAST TECHNIQUE: Multidetector CT imaging of the head and cervical spine was performed following the standard protocol without intravenous contrast. Multiplanar CT image reconstructions of the cervical spine were also generated. COMPARISON:  02/01/2018 FINDINGS: CT HEAD FINDINGS BRAIN: There is a moderate degree of sulcal and ventricular prominence consistent with superficial and central atrophy. No intraparenchymal hemorrhage, mass effect nor midline shift. Moderate-to-marked periventricular and subcortical white matter hypodensities consistent with chronic small vessel ischemic disease are redemonstrated. 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 No acute large vascular territory infarcts. No abnormal extra-axial fluid collections. Basal cisterns are not effaced and midline. VASCULAR: Moderate calcific atherosclerosis of the carotid siphons. SKULL: No skull fracture. No significant scalp soft tissue swelling. SINUSES/ORBITS: The mastoid air-cells are clear. The included paranasal sinuses are well-aerated.The included ocular globes and orbital contents are non-suspicious. OTHER: None. CT CERVICAL SPINE FINDINGS Alignment: Maintained cervical lordosis with 3 mm of retrolisthesis  of C3 on C4 likely degenerative in etiology. Skull base and vertebrae: No acute fracture. No primary bone lesion or focal pathologic process. Soft tissues and spinal canal: No prevertebral fluid or swelling. No visible canal hematoma. Disc levels: Marked disc flattening C3 through C7 with small posterior marginal osteophytes. No jumped or perched facets. No significant central or foraminal stenosis. Upper chest: Negative. Other: None IMPRESSION: Marked cerebral atrophy similar to prior with chronic moderate to marked small vessel ischemic disease and old right frontal lobe infarct. No acute intracranial abnormality. Cervical spondylosis without acute osseous abnormality. Electronically Signed   By: Tollie Eth M.D.   On: 02/05/2018 22:48    Procedures Procedures (including critical care time)  Medications Ordered in ED Medications - No data to display   Initial Impression / Assessment and Plan / ED Course  I have reviewed the triage vital signs and the nursing notes.  Pertinent labs & imaging results that were available during my care of the patient were reviewed by me and considered in my  medical decision making (see chart for details).  77 year old male here for recurrent falls.  He was seen for the same in the ED 4 days ago, discharged 2 days ago after admission.  Only significant finding was some mild orthostatic hypotension that resolved with fluids.  Patient was given 2 mg Ativan IM prior to arrival due to agitation as he was throwing patio furniture at his facility.  He is drowsy here but vitals are stable.  Answers limited questions as he has very advanced dementia.  Will obtain screening head and neck CT.  10:52 PM Imaging negative.  Vitals stable.  Given recent work-up and admission for same without acute findings aside from mild orthostatic hypotension, do not feel full work-up needs to be repeated at this time.  Patient has apparent very advanced dementia and this could very well be  component of his disease.  Will discharge back to facility.    Patient seen and evaluated with attending physician, Dr. Jeraldine Loots, who agrees with assessment and plan of care.  Final Clinical Impressions(s) / ED Diagnoses   Final diagnoses:  Recurrent falls    ED Discharge Orders    None       Garlon Hatchet, PA-C 02/06/18 0230    Gerhard Munch, MD 02/06/18 [phone]

## 2018-02-05 NOTE — ED Triage Notes (Signed)
Pt BIB [facility] for eval of multiple falls. Pt was seen recently for same. Pt was apparently combative per staff and throwing patio furniture. Admin 2mg  ativan by staff at [facility]. Pt arrives drowsy but responsive. Small skin tear to R elbow. C-collar in place.

## 2018-02-17 ENCOUNTER — Encounter (HOSPITAL_COMMUNITY): Payer: Self-pay | Admitting: Emergency Medicine

## 2018-02-17 ENCOUNTER — Emergency Department (HOSPITAL_COMMUNITY)
Admission: EM | Admit: 2018-02-17 | Discharge: 2018-02-17 | Disposition: A | Discharge status: Home / Self Care | Attending: Emergency Medicine | Admitting: Emergency Medicine

## 2018-02-17 DIAGNOSIS — F0280 Dementia in other diseases classified elsewhere without behavioral disturbance: Secondary | ICD-10-CM | POA: Insufficient documentation

## 2018-02-17 DIAGNOSIS — Y999 Unspecified external cause status: Secondary | ICD-10-CM | POA: Insufficient documentation

## 2018-02-17 DIAGNOSIS — Z79899 Other long term (current) drug therapy: Secondary | ICD-10-CM | POA: Diagnosis not present

## 2018-02-17 DIAGNOSIS — S51011A Laceration without foreign body of right elbow, initial encounter: Secondary | ICD-10-CM

## 2018-02-17 DIAGNOSIS — Z87891 Personal history of nicotine dependence: Secondary | ICD-10-CM | POA: Insufficient documentation

## 2018-02-17 DIAGNOSIS — Y9301 Activity, walking, marching and hiking: Secondary | ICD-10-CM | POA: Insufficient documentation

## 2018-02-17 DIAGNOSIS — S50311A Abrasion of right elbow, initial encounter: Secondary | ICD-10-CM | POA: Diagnosis not present

## 2018-02-17 DIAGNOSIS — W010XXA Fall on same level from slipping, tripping and stumbling without subsequent striking against object, initial encounter: Secondary | ICD-10-CM | POA: Diagnosis not present

## 2018-02-17 DIAGNOSIS — Y92129 Unspecified place in nursing home as the place of occurrence of the external cause: Secondary | ICD-10-CM | POA: Diagnosis not present

## 2018-02-17 DIAGNOSIS — G309 Alzheimer's disease, unspecified: Secondary | ICD-10-CM | POA: Diagnosis not present

## 2018-02-17 DIAGNOSIS — S59901A Unspecified injury of right elbow, initial encounter: Secondary | ICD-10-CM | POA: Diagnosis present

## 2018-02-17 DIAGNOSIS — W19XXXA Unspecified fall, initial encounter: Secondary | ICD-10-CM

## 2018-02-17 NOTE — Progress Notes (Signed)
02/17/18  1200 [facility] from [facility] called and stated that patient is a hospice patient. Her number is [phone].

## 2018-02-17 NOTE — ED Triage Notes (Signed)
Per EMS-coming form [facility]-witnessed mechanical fall-did hit head, no LOC, no blood thinners, no complaints of pain-no right elbow abrasion from a fall last week-no obvious signs of injury

## 2018-02-17 NOTE — Progress Notes (Signed)
02/17/18  1240  Called [facility] [phone] Pt on list for transport.

## 2018-02-17 NOTE — ED Notes (Signed)
Bed: ZO10 Expected date:  Expected time:  Means of arrival:  Comments: EMS-fall

## 2018-02-23 NOTE — ED Provider Notes (Signed)
[facility]-EMERGENCY DEPT Provider Note   CSN: [id number] Arrival date & time: 02/17/18  1050     History   Chief Complaint Chief Complaint  Patient presents with  . Fall    HPI Antonio Duran is a 77 y.o. male.  HPI   77 year old male sent from nursing facility for evaluation after witnessed fall.  Reportedly he tripped and fell.  He did strike his head.  No loss of consciousness.  She reportedly at his baseline.  Patient denies any complaints to me.  Denies any pain.  Is not anticoagulated.  Old abrasion to right elbow sustained in fall last week.  Past Medical History:  Diagnosis Date  . AKI (acute kidney injury) (HCC)   . Alzheimer's dementia (HCC)   . Enterococcus UTI   . Fall   . Normocytic anemia     Patient Active Problem List   Diagnosis Date Noted  . Fall 02/02/2018  . 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 Hypokalemia 02/02/2018  . UTI (urinary tract infection) 08/10/2017  . HCAP (healthcare-associated pneumonia) 08/10/2017  . Sepsis, unspecified organism (HCC) 08/09/2017  . Acute renal failure superimposed on stage 3 chronic kidney disease (HCC) 08/09/2017  . Sepsis (HCC) 08/09/2017  . Normocytic anemia   . Lung nodule, solitary   . Mixed dementia (HCC)   . Palliative care by specialist   . Agitation   . Insomnia   . Enterococcus UTI 01/18/2016  . Confusion 01/18/2016  . Dementia with behavioral disturbance (HCC) 01/18/2016    History reviewed. No pertinent surgical history.      Home Medications    Prior to Admission medications   Medication Sig Start Date End Date Taking? Authorizing Provider  acetaminophen (TYLENOL) 500 MG tablet Take 500 mg by mouth every 4 (four) hours as needed for mild pain, moderate pain, fever or headache (AND NOT TO EXCEED 2,000 MG IN A 24-HR PERIOD).     [provider]  albuterol (PROVENTIL) (2.5 MG/3ML) 0.083% nebulizer solution Take 2.5 mg by nebulization every 6 (six) hours as  needed for wheezing or shortness of breath.    [provider]  alum & mag hydroxide-simeth (MINTOX) 200-200-20 MG/5ML suspension Take 30 mLs by mouth every 6 (six) hours as needed for indigestion or heartburn.    [provider]  bisacodyl (DULCOLAX) 10 MG suppository Place 10 mg rectally daily as needed (for constipation).     [provider]  divalproex (DEPAKOTE SPRINKLE) 125 MG capsule Take 250 mg by mouth 3 (three) times daily.     [provider]  estradiol (ESTRACE) 0.5 MG tablet Take 0.5 mg by mouth daily.    [provider]  guaifenesin (ROBITUSSIN) 100 MG/5ML syrup Take 200 mg by mouth every 6 (six) hours as needed for cough (AND NOT TO EXCEED 4 DOSES IN 24 HOURS).     [provider]  loperamide (IMODIUM) 2 MG capsule Take 2 mg by mouth See admin instructions. Take 2 mg by mouth with each loose stool as needed for diarrhea/not to exceed 8 doses in 24 hours    [provider]  LORazepam (ATIVAN) 1 MG tablet Take 1 tablet (1 mg total) by mouth every 4 (four) hours as needed for anxiety or sedation. 07/30/17   Charm Rings, NP  magnesium hydroxide (MILK OF MAGNESIA) 400 MG/5ML suspension Take 30 mLs by mouth at bedtime as needed for mild constipation.    [provider]  Melatonin 5 MG TABS Take  1 tablet by mouth every evening.    [provider]  memantine (NAMENDA XR) 28 MG CP24 24 hr capsule Take 28 mg by mouth daily.     [provider]  neomycin-bacitracin-polymyxin (NEOSPORIN) [phone] ointment Apply 1 application topically 4 (four) times daily as needed (skin tears).     [provider]  OLANZapine (ZYPREXA) 5 MG tablet Take 1 tablet (5 mg total) by mouth every 8 (eight) hours as needed (for aggitation). Patient taking differently: Take 5 mg by mouth every 8 (eight) hours as needed (for agitation).  07/30/17   Gwyneth Sprout, MD  OVER THE COUNTER MEDICATION Mighty shakes: Drink 1  shake by mouth 3 times a day with meals    [provider]  OXYGEN Inhale 2 L into the lungs as needed (for shortness of breath).    [provider]  PARoxetine (PAXIL) 20 MG tablet Take 1 tablet (20 mg total) by mouth at bedtime. 07/30/17   Charm Rings, NP  QUEtiapine (SEROQUEL) 100 MG tablet Take 1 tablet (100 mg total) by mouth at bedtime. 07/30/17   Charm Rings, NP  QUEtiapine (SEROQUEL) 25 MG tablet Take 25 mg by mouth daily at 2 PM.     [provider]  sennosides-docusate sodium (SENOKOT-S) 8.6-50 MG tablet Take 2 tablets by mouth See admin instructions. Take 2 tablets by mouth at 5 PM daily    [provider]  triamcinolone cream (KENALOG) 0.5 % Apply 1 application topically See admin instructions. Apply to reddened areas on buttocks two times a day until healed for psoriasis    [provider]    Family History Family History  Problem Relation Age of Onset  . Dementia Sister     Social History Social History   Tobacco Use  . Smoking status: Former Games developer  . Smokeless tobacco: Never Used  Substance Use Topics  . Alcohol use: No  . Drug use: No     Allergies   Zolpidem   Review of Systems Review of Systems  All systems reviewed and negative, other than as noted in HPI. Physical Exam Updated Vital Signs BP (!) 151/93 (BP Location: Left Arm)   Pulse 69   Temp 98.2 F (36.8 C) (Oral)   Resp 20   SpO2 95%   Physical Exam  Constitutional: He appears well-developed and well-nourished. No distress.  HENT:  Head: Normocephalic and atraumatic.  Eyes: Conjunctivae are normal. Right eye exhibits no discharge. Left eye exhibits no discharge.  Neck: Neck supple.  Cardiovascular: Normal rate, regular rhythm and normal heart sounds. Exam reveals no gallop and no friction rub.  No murmur heard. Pulmonary/Chest: Effort normal and breath sounds normal. No respiratory distress.  Abdominal: Soft. He exhibits no distension.  There is no tenderness.  Musculoskeletal: He exhibits no edema or tenderness.  Subacute appearing abrasion to R elbow. No midline spinal tenderness. No bony tenderness of extremities.   Neurological: He is alert.  Skin: Skin is warm and dry.  Psychiatric: He has a normal mood and affect. His behavior is normal. Thought content normal.  Nursing note and vitals reviewed.    ED Treatments / Results  Labs (all labs ordered are listed, but only abnormal results are displayed) Labs Reviewed - No data to display  EKG None  Radiology No results found.   Dg Chest 2 View  Result Date: 02/02/2018 CLINICAL DATA:  Fall, altered mental status EXAM: CHEST - 2 VIEW COMPARISON:  08/09/2017 chest radiograph. FINDINGS:  Stable cardiomediastinal silhouette with normal heart size. No pneumothorax. No pleural effusion. Lungs appear clear, with no acute consolidative airspace disease and no pulmonary edema. No displaced fractures. Scattered healed right rib deformities. IMPRESSION: No active cardiopulmonary disease. Electronically Signed   By: Delbert Phenix M.D.   On: 02/02/2018 02:34   Ct Head Wo Contrast  Result Date: 02/05/2018 CLINICAL DATA:  Headache status post multiple falls. History of Alzheimer's. EXAM: CT HEAD WITHOUT CONTRAST CT CERVICAL SPINE WITHOUT CONTRAST TECHNIQUE: Multidetector CT imaging of the head and cervical spine was performed following the standard protocol without intravenous contrast. Multiplanar CT image reconstructions of the cervical spine were also generated. COMPARISON:  02/01/2018 FINDINGS: CT HEAD FINDINGS BRAIN: There is a moderate degree of sulcal and ventricular prominence consistent with superficial and central atrophy. No intraparenchymal hemorrhage, mass effect nor midline shift. Moderate-to-marked periventricular and subcortical white matter hypodensities consistent with chronic small vessel ischemic disease are redemonstrated. 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 No acute large vascular territory infarcts. No abnormal extra-axial fluid collections. Basal cisterns are not effaced and midline. VASCULAR: Moderate calcific atherosclerosis of the carotid siphons. SKULL: No skull fracture. No significant scalp soft tissue swelling. SINUSES/ORBITS: The mastoid air-cells are clear. The included paranasal sinuses are well-aerated.The included ocular globes and orbital contents are non-suspicious. OTHER: None. CT CERVICAL SPINE FINDINGS Alignment: Maintained cervical lordosis with 3 mm of retrolisthesis of C3 on C4 likely degenerative in etiology. Skull base and vertebrae: No acute fracture. No primary bone lesion or focal pathologic process. Soft tissues and spinal canal: No prevertebral fluid or swelling. No visible canal hematoma. Disc levels: Marked disc flattening C3 through C7 with small posterior marginal osteophytes. No jumped or perched facets. No significant central or foraminal stenosis. Upper chest: Negative. Other: None IMPRESSION: Marked cerebral atrophy similar to prior with chronic moderate to marked small vessel ischemic disease and old right frontal lobe infarct. No acute intracranial abnormality. Cervical spondylosis without acute osseous abnormality. Electronically Signed   By: Tollie Eth M.D.   On: 02/05/2018 22:48   Ct Head Wo Contrast  Result Date: 02/01/2018 CLINICAL DATA:  Alzheimer's.  Falls EXAM: CT HEAD WITHOUT CONTRAST CT CERVICAL SPINE WITHOUT CONTRAST TECHNIQUE: Multidetector CT imaging of the head and cervical spine was performed following the standard protocol without intravenous contrast. Multiplanar CT image reconstructions of the cervical spine were also generated. COMPARISON:  01/01/2018 FINDINGS: CT HEAD FINDINGS Brain: There is atrophy and chronic small vessel disease changes. Old right frontal infarct. No acute intracranial abnormality. Specifically, no hemorrhage, hydrocephalus, mass lesion, acute infarction, or  significant intracranial injury. Vascular: No hyperdense vessel or unexpected calcification. Skull: No acute calvarial abnormality. Sinuses/Orbits: Visualized paranasal sinuses and mastoids clear. Orbital soft tissues unremarkable. Other: None CT CERVICAL SPINE FINDINGS Alignment: No subluxation. Skull base and vertebrae: No acute fracture. No primary bone lesion or focal pathologic process. Soft tissues and spinal canal: No prevertebral fluid or swelling. No visible canal hematoma. Disc levels:  Diffuse degenerative disc and facet disease. Upper chest: No acute findings Other: No acute findings IMPRESSION: Atrophy, chronic small vessel disease.  Old right frontal infarct. No acute intracranial abnormality. Diffuse cervical spondylosis.  No acute bony abnormality. Electronically Signed   By: Charlett Nose M.D.   On: 02/01/2018 22:56   Ct Cervical Spine Wo Contrast  Result Date: 02/05/2018 CLINICAL DATA:  Headache status post multiple falls. History of Alzheimer's. EXAM: CT HEAD WITHOUT CONTRAST CT CERVICAL SPINE WITHOUT CONTRAST TECHNIQUE: Multidetector CT imaging of the  head and cervical spine was performed following the standard protocol without intravenous contrast. Multiplanar CT image reconstructions of the cervical spine were also generated. COMPARISON:  02/01/2018 FINDINGS: CT HEAD FINDINGS BRAIN: There is a moderate degree of sulcal and ventricular prominence consistent with superficial and central atrophy. No intraparenchymal hemorrhage, mass effect nor midline shift. Moderate-to-marked periventricular and subcortical white matter hypodensities consistent with chronic small vessel ischemic disease are redemonstrated. 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 No acute large vascular territory infarcts. No abnormal extra-axial fluid collections. Basal cisterns are not effaced and midline. VASCULAR: Moderate calcific atherosclerosis of the carotid siphons. SKULL: No skull fracture. No  significant scalp soft tissue swelling. SINUSES/ORBITS: The mastoid air-cells are clear. The included paranasal sinuses are well-aerated.The included ocular globes and orbital contents are non-suspicious. OTHER: None. CT CERVICAL SPINE FINDINGS Alignment: Maintained cervical lordosis with 3 mm of retrolisthesis of C3 on C4 likely degenerative in etiology. Skull base and vertebrae: No acute fracture. No primary bone lesion or focal pathologic process. Soft tissues and spinal canal: No prevertebral fluid or swelling. No visible canal hematoma. Disc levels: Marked disc flattening C3 through C7 with small posterior marginal osteophytes. No jumped or perched facets. No significant central or foraminal stenosis. Upper chest: Negative. Other: None IMPRESSION: Marked cerebral atrophy similar to prior with chronic moderate to marked small vessel ischemic disease and old right frontal lobe infarct. No acute intracranial abnormality. Cervical spondylosis without acute osseous abnormality. Electronically Signed   By: Tollie Eth M.D.   On: 02/05/2018 22:48   Ct Cervical Spine Wo Contrast  Result Date: 02/01/2018 CLINICAL DATA:  Alzheimer's.  Falls EXAM: CT HEAD WITHOUT CONTRAST CT CERVICAL SPINE WITHOUT CONTRAST TECHNIQUE: Multidetector CT imaging of the head and cervical spine was performed following the standard protocol without intravenous contrast. Multiplanar CT image reconstructions of the cervical spine were also generated. COMPARISON:  01/01/2018 FINDINGS: CT HEAD FINDINGS Brain: There is atrophy and chronic small vessel disease changes. Old right frontal infarct. No acute intracranial abnormality. Specifically, no hemorrhage, hydrocephalus, mass lesion, acute infarction, or significant intracranial injury. Vascular: No hyperdense vessel or unexpected calcification. Skull: No acute calvarial abnormality. Sinuses/Orbits: Visualized paranasal sinuses and mastoids clear. Orbital soft tissues unremarkable. Other: None  CT CERVICAL SPINE FINDINGS Alignment: No subluxation. Skull base and vertebrae: No acute fracture. No primary bone lesion or focal pathologic process. Soft tissues and spinal canal: No prevertebral fluid or swelling. No visible canal hematoma. Disc levels:  Diffuse degenerative disc and facet disease. Upper chest: No acute findings Other: No acute findings IMPRESSION: Atrophy, chronic small vessel disease.  Old right frontal infarct. No acute intracranial abnormality. Diffuse cervical spondylosis.  No acute bony abnormality. Electronically Signed   By: Charlett Nose M.D.   On: 02/01/2018 22:56    Procedures Procedures (including critical care time)  Medications Ordered in ED Medications - No data to display   Initial Impression / Assessment and Plan / ED Course  I have reviewed the triage vital signs and the nursing notes.  Pertinent labs & imaging results that were available during my care of the patient were reviewed by me and considered in my medical decision making (see chart for details).     78 year old male presenting after reported mechanical fall.  No complaints to me but he does have history of Alzheimer's dementia.  Reportedly at his baseline.  Negative imaging.  I doubt emergent traumatic injury.  Final Clinical Impressions(s) / ED Diagnoses   Final diagnoses:  Fall, initial encounter  Skin tear of right elbow without complication, initial encounter    ED Discharge Orders    None       Raeford Razor, MD 02/23/18 1439

## 2018-04-12 DEATH — deceased

## 2019-07-24 IMAGING — CT CT CERVICAL SPINE W/O CM
5 of 8 series · 11 of 33 positions shown, 12 images · non-contrast
Comparison: 02/01/2018

CLINICAL DATA: Headache status post multiple falls. History of
Alzheimer's.

EXAM:
CT HEAD WITHOUT CONTRAST
CT CERVICAL SPINE WITHOUT CONTRAST
TECHNIQUE: Multidetector CT imaging of the head and cervical spine was
performed following the standard protocol without intravenous
contrast. Multiplanar CT image reconstructions of the cervical spine
were also generated.

[Series 5: head bone · axial · 0.46mm/px · z∈[-117,-61]mm · 2 of 85 slices shown]
[im 29/85  bone]
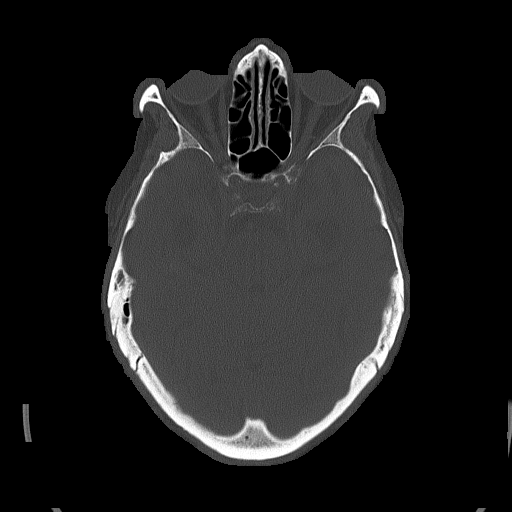
[im 57/85  bone]
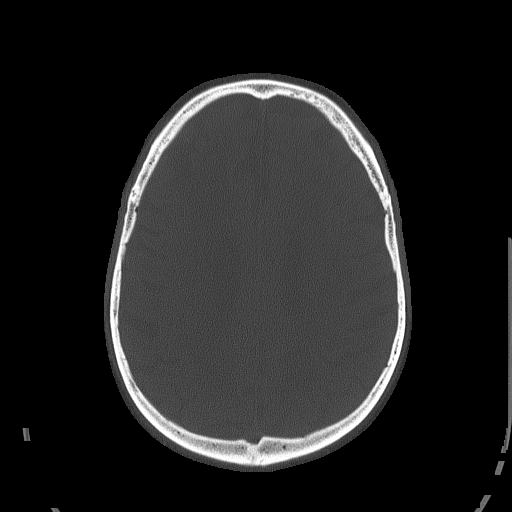

[Series 8: c_spine 2.0 st · axial · 0.32mm/px · z∈[-268,-214]mm · 2 of 81 slices shown, 3 images]
[im 27/81  soft-tissue]
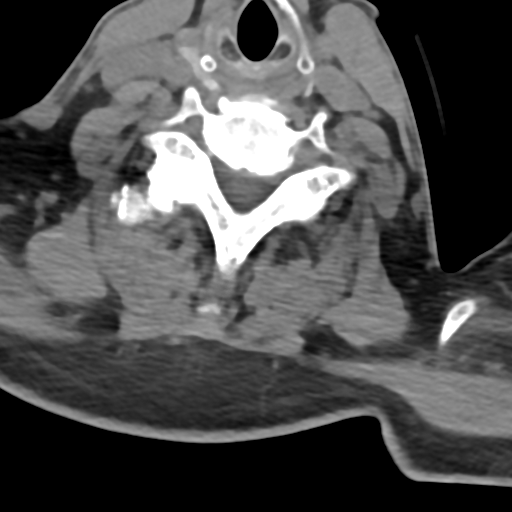
[im 27/81  bone]
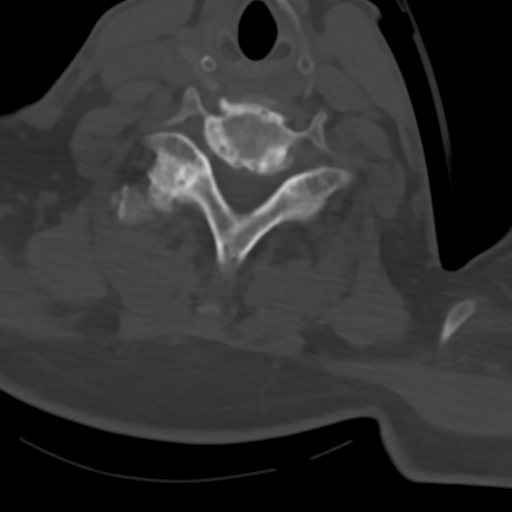
[im 54/81  bone]
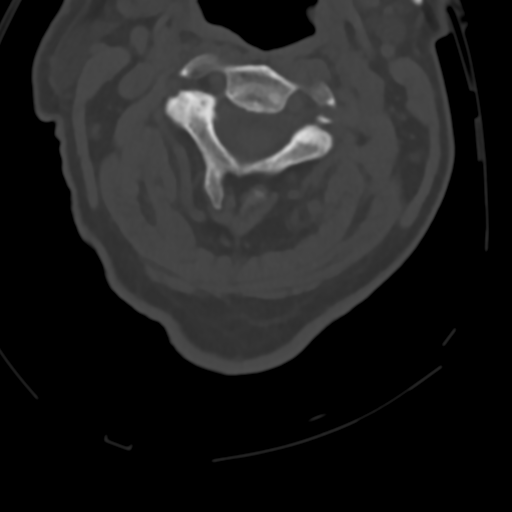

[Series 11: c_spine 2.0 orthogonals · axial · 0.21mm/px · z∈[-295,-237]mm · 2 of 89 slices shown]
[im 30/89  bone]
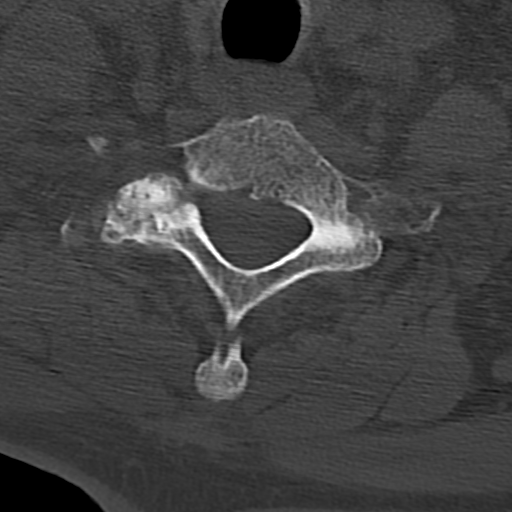
[im 59/89  bone]
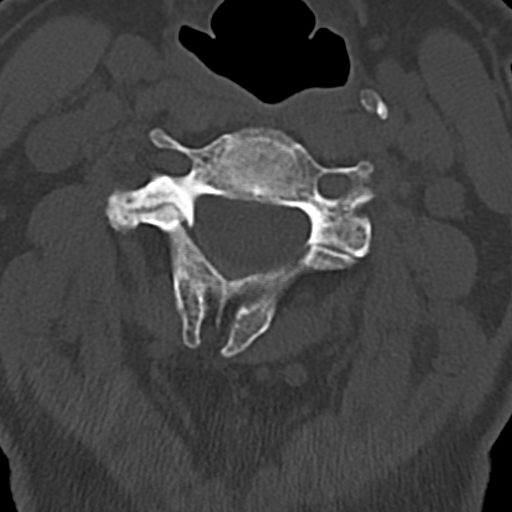

[Series 13: c_spine 2.0 sag bone · sagittal · 0.24mm/px · 4 of 61 slices shown]
[im 13/61  bone]
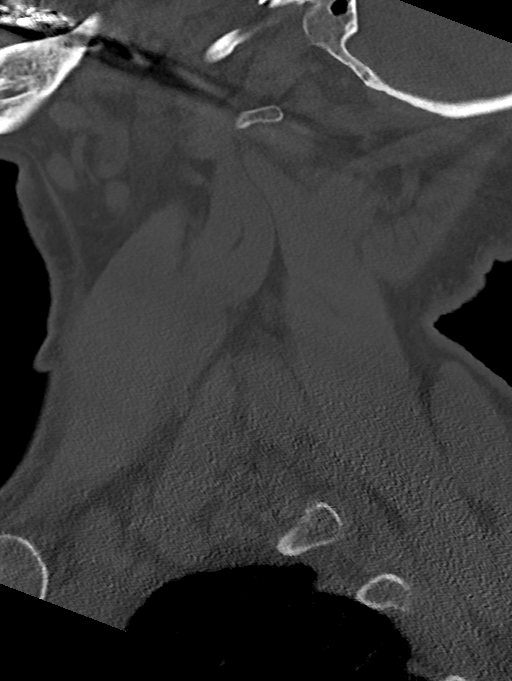
[im 25/61  bone]
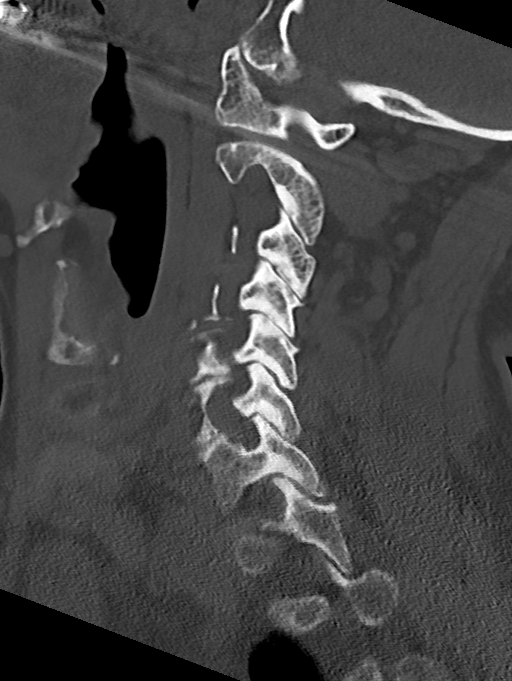
[im 37/61  bone]
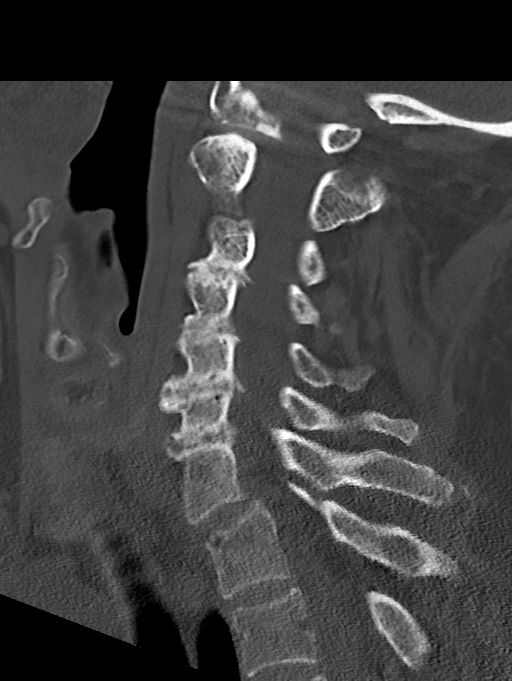
[im 49/61  bone]
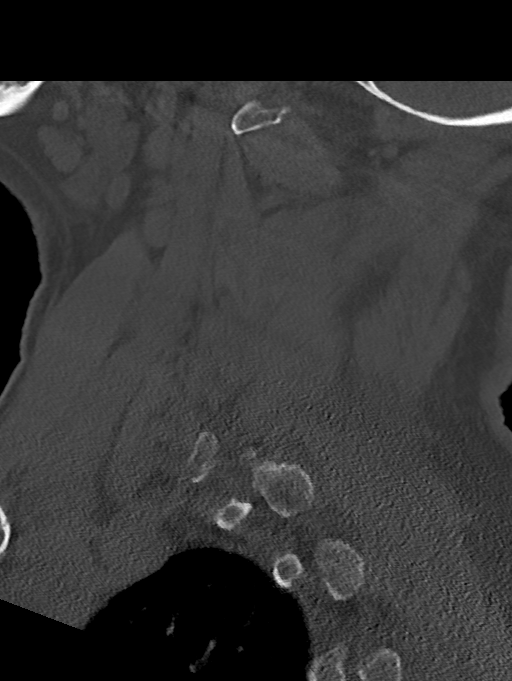

[Series 14: c_spine 2.0 cor bone · coronal · 0.23mm/px · 1 of 61 slices shown]
[im 31/61  bone]
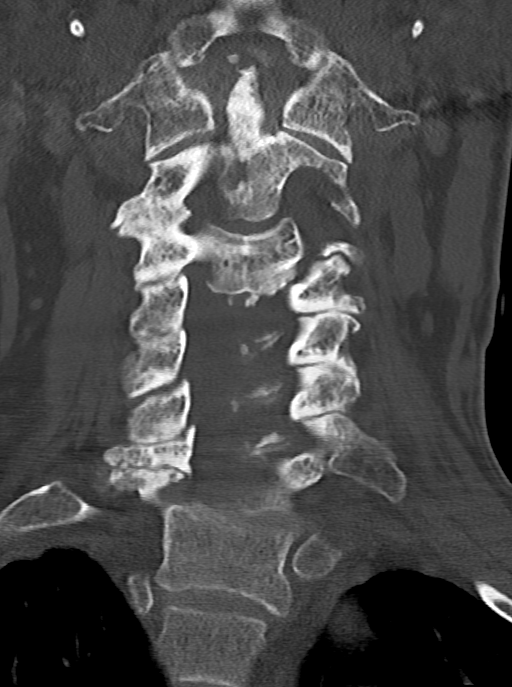

[11 of 33 positions shown; findings below may reference images not displayed]

FINDINGS: CT HEAD FINDINGS

BRAIN: There is a moderate degree of sulcal and ventricular
prominence consistent with superficial and central atrophy. No
intraparenchymal hemorrhage, mass effect nor midline shift.
Moderate-to-marked periventricular and subcortical white matter
hypodensities consistent with chronic small vessel ischemic disease
are redemonstrated. Old right frontal lobe infarct with
encephalomalacia. No acute large vascular territory infarcts. No
abnormal extra-axial fluid collections. Basal cisterns are not
effaced and midline.

VASCULAR: Moderate calcific atherosclerosis of the carotid siphons.

SKULL: No skull fracture. No significant scalp soft tissue swelling.

SINUSES/ORBITS: The mastoid air-cells are clear. The included
paranasal sinuses are well-aerated.The included ocular globes and
orbital contents are non-suspicious.

OTHER: None.

CT CERVICAL SPINE FINDINGS

Alignment: Maintained cervical lordosis with 3 mm of retrolisthesis
of C3 on C4 likely degenerative in etiology.

Skull base and vertebrae: No acute fracture. No primary bone lesion
or focal pathologic process.

Soft tissues and spinal canal: No prevertebral fluid or swelling. No
visible canal hematoma.

Disc levels: Marked disc flattening C3 through C7 with small
posterior marginal osteophytes. No jumped or perched facets. No
significant central or foraminal stenosis.

Upper chest: Negative.

Other: None
IMPRESSION: Marked cerebral atrophy similar to prior with chronic moderate to
marked small vessel ischemic disease and old right frontal lobe
infarct. No acute intracranial abnormality.

Cervical spondylosis without acute osseous abnormality.
